# Patient Record
Sex: Female | Born: 1995 | Race: Black or African American | Hispanic: No | Smoking: Never smoker
Health system: Southern US, Community
[De-identification: ages and names within clinical notes are randomized; demographics above are authoritative.]

## PROBLEM LIST (undated history)

## (undated) ENCOUNTER — Inpatient Hospital Stay (HOSPITAL_COMMUNITY): Payer: Self-pay

## (undated) DIAGNOSIS — B999 Unspecified infectious disease: Secondary | ICD-10-CM

## (undated) HISTORY — PX: NO PAST SURGERIES: SHX2092

## (undated) HISTORY — PX: TOOTH EXTRACTION: SUR596

---
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
## 2013-03-14 ENCOUNTER — Encounter: Payer: Self-pay | Admitting: Advanced Practice Midwife

## 2013-04-05 ENCOUNTER — Ambulatory Visit: Payer: Self-pay | Admitting: Advanced Practice Midwife

## 2013-05-22 ENCOUNTER — Emergency Department (INDEPENDENT_AMBULATORY_CARE_PROVIDER_SITE_OTHER)
Admission: EM | Admit: 2013-05-22 | Discharge: 2013-05-22 | Disposition: A | Discharge status: Home / Self Care | Payer: Medicaid Other | Source: Home / Self Care | Attending: Family Medicine | Admitting: Family Medicine

## 2013-05-22 ENCOUNTER — Encounter (HOSPITAL_COMMUNITY): Payer: Self-pay | Admitting: Emergency Medicine

## 2013-05-22 DIAGNOSIS — H10219 Acute toxic conjunctivitis, unspecified eye: Secondary | ICD-10-CM

## 2013-05-22 DIAGNOSIS — H10212 Acute toxic conjunctivitis, left eye: Secondary | ICD-10-CM

## 2013-05-22 MED ORDER — PREDNISOLONE ACETATE 0.12 % OP SUSP: 1.0000 [drp] | OPHTHALMIC | Status: DC

## 2013-05-22 MED ORDER — TETRACAINE HCL 0.5 % OP SOLN
OPHTHALMIC | Status: AC
Start: 2013-05-22 — End: 2013-05-22
  Filled 2013-05-22: qty 2

## 2013-05-22 NOTE — ED Notes (Signed)
L  Eye  Irritated     With  Morgan lens        500  ml

## 2013-05-22 NOTE — ED Provider Notes (Signed)
CSN: [id number]     Arrival date & time 05/22/13  1556 History   First MD Initiated Contact with Patient 05/22/13 1609     Chief Complaint  Patient presents with  . Eye Problem   (Consider location/radiation/quality/duration/timing/severity/associated sxs/prior Treatment) Patient is a 18 y.o. female presenting with eye problem. The history is provided by the patient.  Eye Problem Location:  L eye Quality:  Burning Severity:  Mild Onset quality:  Sudden Duration:  4 hours Progression:  Unchanged Chronicity:  New Context: contact lenses   Context comment:  Put contact cleaning sol in left eye, has flushed eye, still with discomfort, Ineffective treatments:  Flushing Associated symptoms: blurred vision and decreased vision   Associated symptoms: no discharge, no double vision, no itching, no photophobia and no redness     History reviewed. No pertinent past medical history. History reviewed. No pertinent past surgical history. No family history on file. History  Substance Use Topics  . Smoking status: Never Smoker   . Smokeless tobacco: Not on file  . Alcohol Use: No   OB History   Grav Para Term Preterm Abortions TAB SAB Ect Mult Living                 Review of Systems  Constitutional: Negative.   HENT: Negative.   Eyes: Positive for blurred vision and visual disturbance. Negative for double vision, photophobia, pain, discharge, redness and itching.    Allergies  Review of patient's allergies indicates no known allergies.  Home Medications   Prior to Admission medications   Medication Sig Start Date End Date Taking? Authorizing Provider  cephALEXin (KEFLEX) 500 MG capsule Take 1 capsule (500 mg total) by mouth every 6 (six) hours. 07/06/12   Zonia KiefJames Owens, PA-C  doxycycline (VIBRAMYCIN) 100 MG capsule Take 1 capsule (100 mg total) by mouth 2 (two) times daily. 08/03/12   Linna HoffJames D Anahi Belmar, MD  HYDROcodone-acetaminophen (NORCO) 5-325 MG per tablet Take 1 tablet by mouth  every 8 (eight) hours as needed for pain. 07/06/12   Zonia KiefJames Owens, PA-C  sulfamethoxazole-trimethoprim (SEPTRA DS) 800-160 MG per tablet Take 1 tablet by mouth every 12 (twelve) hours. 07/16/12   Zonia KiefJames Owens, PA-C   BP 118/70  Pulse 83  Temp(Src) 98.2 F (36.8 C) (Oral)  Resp 18  SpO2 100% Physical Exam  Nursing note and vitals reviewed. Constitutional: She appears well-developed and well-nourished.  Eyes: Conjunctivae, EOM and lids are normal. Pupils are equal, round, and reactive to light. Lids are everted and swept, no foreign bodies found. Right eye exhibits no discharge. Left eye exhibits no discharge and no exudate. No foreign body present in the left eye. Right conjunctiva is not injected. Left conjunctiva is not injected.  Slit lamp exam:      The left eye shows no foreign body and no hyphema.    ED Course  Procedures (including critical care time) Labs Review Labs Reviewed - No data to display  Imaging Review No results found.   MDM   1. Acute chemical conjunctivitis of left eye    Eye irrigated with 1liter ns via morgan lens , sx improved    Linna HoffJames D Lynnix Schoneman, MD 05/22/13 1644

## 2013-05-22 NOTE — ED Notes (Signed)
Pt  Reports    She got   Contact  Lens  Cleaning  Solution  In l  Eye  About  3  Hours  Ago  She  Reports    Irritated    And  Burning            She  Reports  She  Attempted  To  Wash  Her  Eye  Out         She  Normally   Wears  Contacts    Lenses  And  Cannot  See  Well  Without  Them

## 2013-05-22 NOTE — Discharge Instructions (Signed)
Use eye medicine as prescribed and call for recheck with eye doctor if further problems.

## 2013-05-22 NOTE — ED Notes (Signed)
Pt put contact cleaning solution in lt eye instead of saline; flushed eye for some time afterwards.  Pt not wearing contacts, and unable to focus on eye chart through either eye or both simultaneously.

## 2014-07-27 ENCOUNTER — Encounter (HOSPITAL_COMMUNITY): Payer: Self-pay

## 2014-07-27 ENCOUNTER — Emergency Department (HOSPITAL_COMMUNITY)
Admission: EM | Admit: 2014-07-27 | Discharge: 2014-07-28 | Disposition: A | Discharge status: Home / Self Care | Payer: Medicaid Other | Attending: Emergency Medicine | Admitting: Emergency Medicine

## 2014-07-27 DIAGNOSIS — Y9289 Other specified places as the place of occurrence of the external cause: Secondary | ICD-10-CM | POA: Insufficient documentation

## 2014-07-27 DIAGNOSIS — X58XXXA Exposure to other specified factors, initial encounter: Secondary | ICD-10-CM | POA: Insufficient documentation

## 2014-07-27 DIAGNOSIS — Y990 Civilian activity done for income or pay: Secondary | ICD-10-CM | POA: Insufficient documentation

## 2014-07-27 DIAGNOSIS — S39012A Strain of muscle, fascia and tendon of lower back, initial encounter: Secondary | ICD-10-CM

## 2014-07-27 DIAGNOSIS — Y9389 Activity, other specified: Secondary | ICD-10-CM | POA: Insufficient documentation

## 2014-07-27 DIAGNOSIS — Z792 Long term (current) use of antibiotics: Secondary | ICD-10-CM | POA: Insufficient documentation

## 2014-07-27 NOTE — ED Notes (Signed)
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  Reports sharp pains continue, worse with movement.

## 2014-07-28 MED ORDER — NAPROXEN 500 MG PO TABS: 500.0000 mg | ORAL_TABLET | Freq: Two times a day (BID) | ORAL | Status: DC

## 2014-07-28 MED ORDER — METHOCARBAMOL 500 MG PO TABS: 500.0000 mg | ORAL_TABLET | Freq: Two times a day (BID) | ORAL | Status: DC

## 2014-07-28 MED ORDER — NAPROXEN 500 MG PO TABS
500.0000 mg | ORAL_TABLET | Freq: Once | ORAL | Status: AC
  Administered 2014-07-28: 500 mg via ORAL
  Filled 2014-07-28: qty 1

## 2014-07-28 NOTE — ED Provider Notes (Signed)
CSN: [id number]     Arrival date & time 07/27/14  2317 History   First MD Initiated Contact with Patient 07/28/14 0008     Chief Complaint  Patient presents with  . Back Pain    (Consider location/radiation/quality/duration/timing/severity/associated sxs/prior Treatment) Patient is a 19 y.o. female presenting with back pain. The history is provided by the patient. No language interpreter was used.  Back Pain Location:  Lumbar spine Quality:  Aching and stiffness Radiates to:  Does not radiate Pain severity:  Mild Worse during: worse with movement and ambulation. Onset quality:  Sudden Duration:  2 hours Timing:  Intermittent Progression:  Waxing and waning Chronicity:  New Context: lifting heavy objects   Context comment:  Patient lifting a patient at work Relieved by:  Nothing Worsened by:  Ambulation, movement and bending Ineffective treatments:  None tried Associated symptoms: no bladder incontinence, no bowel incontinence, no dysuria, no fever, no numbness, no paresthesias, no perianal numbness and no tingling     History reviewed. No pertinent past medical history. History reviewed. No pertinent past surgical history. No family history on file. History  Substance Use Topics  . Smoking status: Never Smoker   . Smokeless tobacco: Not on file  . Alcohol Use: No   OB History    No data available      Review of Systems  Constitutional: Negative for fever.  Gastrointestinal: Negative for bowel incontinence.  Genitourinary: Negative for bladder incontinence and dysuria.  Musculoskeletal: Positive for back pain.  Neurological: Negative for tingling, numbness and paresthesias.  All other systems reviewed and are negative.   Allergies  Review of patient's allergies indicates no known allergies.  Home Medications   Prior to Admission medications   Medication Sig Start Date End Date Taking? Authorizing Provider  cephALEXin (KEFLEX) 500 MG capsule Take 1 capsule  (500 mg total) by mouth every 6 (six) hours. 07/06/12   Naida Sleight, PA-C  doxycycline (VIBRAMYCIN) 100 MG capsule Take 1 capsule (100 mg total) by mouth 2 (two) times daily. 08/03/12   Linna Hoff, MD  HYDROcodone-acetaminophen (NORCO) 5-325 MG per tablet Take 1 tablet by mouth every 8 (eight) hours as needed for pain. 07/06/12   Naida Sleight, PA-C  methocarbamol (ROBAXIN) 500 MG tablet Take 1 tablet (500 mg total) by mouth 2 (two) times daily. 07/28/14   Antony Madura, PA-C  naproxen (NAPROSYN) 500 MG tablet Take 1 tablet (500 mg total) by mouth 2 (two) times daily. 07/28/14   Antony Madura, PA-C  prednisoLONE acetate (PRED MILD) 0.12 % ophthalmic suspension Place 1 drop into the left eye every 4 (four) hours. For 3 days 05/22/13   Linna Hoff, MD  sulfamethoxazole-trimethoprim (SEPTRA DS) 800-160 MG per tablet Take 1 tablet by mouth every 12 (twelve) hours. 07/16/12   Naida Sleight, PA-C   BP 135/65 mmHg  Pulse 76  Temp(Src) 98.6 F (37 C) (Oral)  Resp 16  SpO2 100% Physical Exam  Constitutional: She is oriented to person, place, and time. She appears well-developed and well-nourished. No distress.  HENT:  Head: Normocephalic and atraumatic.  Eyes: Conjunctivae and EOM are normal. No scleral icterus.  Neck: Normal range of motion.  Cardiovascular: Normal rate, regular rhythm and intact distal pulses.   DP and PT pulses 2+ bilaterally  Pulmonary/Chest: Effort normal. No respiratory distress.  Musculoskeletal: Normal range of motion. She exhibits tenderness.  Tenderness to palpation to the left lumbar paraspinal muscles without significant spasm. No tenderness to palpation  to the thoracic or lumbar midline. No bony deformities, step-offs, or crepitus.  Neurological: She is alert and oriented to person, place, and time. She exhibits normal muscle tone. Coordination normal.  Sensation to light touch intact in all extremities. Patient ambulatory with steady gait.  Skin: Skin is warm and dry. No  rash noted. She is not diaphoretic. No erythema. No pallor.  Psychiatric: She has a normal mood and affect. Her behavior is normal.  Nursing note and vitals reviewed.   ED Course  Procedures (including critical care time) Labs Review Labs Reviewed - No data to display  Imaging Review No results found.   EKG Interpretation None      MDM   Final diagnoses:  Low back strain, initial encounter    Patient with back pain x 2 hours. 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 Patient is neurovascularly intact. Patient can walk but states is painful. No loss of bowel or bladder control. No concern for cauda equina. No fever, urinary symptoms, or weakness. RICE protocol, NSAIDs, and Robaxin indicated and discussed with patient. Return precautions discussed and provided. Patient discharged in good condition with no unaddressed concerns.   Filed Vitals:   07/27/14 2344  BP: 135/65  Pulse: 76  Temp: 98.6 F (37 C)  TempSrc: Oral  Resp: 16  SpO2: 100%     Antony Madura, PA-C 07/28/14 0034  Layla Maw Ward, DO 07/28/14 1191

## 2014-07-28 NOTE — Discharge Instructions (Signed)

## 2015-01-07 NOTE — L&D Delivery Note (Signed)
20 y/o G1P1 who presented for post dates induction under went Cytotec, foley balloon and pitocin induction  Delivery Note At  a viable Female was delivered via  (Presentation:cephalic ;  ).  APGAR:8 ,9 ; w.   Placenta status: delivered intact with gentle traction.  Cord: 3 vessel with the following complications:one loose nucal .    Anesthesia:  epidural Episiotomy:   none Lacerations:   none Est. Blood Loss (mL):   100  Mom to postpartum.  Baby to Couplet care / Skin to Skin.  Ernestina Pennaicholas Dover Jon 09/05/2015, 1:02 AM

## 2015-02-22 LAB — HIV ANTIBODY: HIV SCREEN 4TH GENERATION: NONREACTIVE

## 2015-02-22 LAB — OB RESULTS CONSOLE RPR
RPR: NONREACTIVE
RPR: NONREACTIVE

## 2015-02-22 LAB — OB RESULTS CONSOLE HIV ANTIBODY (ROUTINE TESTING)
HIV: NONREACTIVE
HIV: NONREACTIVE

## 2015-02-22 LAB — SICKLE CELL SCREEN: Sickle Cell Screen: NEGATIVE

## 2015-02-22 LAB — OB RESULTS CONSOLE HEPATITIS B SURFACE ANTIGEN: Hepatitis B Surface Ag: NEGATIVE

## 2015-02-22 LAB — RPR: RPR: NONREACTIVE

## 2015-02-22 LAB — OB RESULTS CONSOLE RUBELLA ANTIBODY, IGM: Rubella: IMMUNE

## 2015-03-17 ENCOUNTER — Encounter (HOSPITAL_COMMUNITY): Payer: Self-pay

## 2015-03-17 ENCOUNTER — Inpatient Hospital Stay (HOSPITAL_COMMUNITY)
Admission: AD | Admit: 2015-03-17 | Discharge: 2015-03-17 | Disposition: A | Discharge status: Home / Self Care | Payer: Medicaid Other | Source: Ambulatory Visit | Attending: Obstetrics | Admitting: Obstetrics

## 2015-03-17 DIAGNOSIS — B9689 Other specified bacterial agents as the cause of diseases classified elsewhere: Secondary | ICD-10-CM

## 2015-03-17 DIAGNOSIS — A499 Bacterial infection, unspecified: Secondary | ICD-10-CM

## 2015-03-17 DIAGNOSIS — O26852 Spotting complicating pregnancy, second trimester: Secondary | ICD-10-CM

## 2015-03-17 DIAGNOSIS — N760 Acute vaginitis: Secondary | ICD-10-CM | POA: Diagnosis not present

## 2015-03-17 DIAGNOSIS — O23592 Infection of other part of genital tract in pregnancy, second trimester: Secondary | ICD-10-CM | POA: Insufficient documentation

## 2015-03-17 DIAGNOSIS — Z3A16 16 weeks gestation of pregnancy: Secondary | ICD-10-CM | POA: Diagnosis not present

## 2015-03-17 LAB — URINALYSIS, ROUTINE W REFLEX MICROSCOPIC
BILIRUBIN URINE: NEGATIVE
GLUCOSE, UA: NEGATIVE mg/dL
HGB URINE DIPSTICK: NEGATIVE
Ketones, ur: 40 mg/dL — AB
Leukocytes, UA: NEGATIVE
Nitrite: NEGATIVE
PROTEIN: NEGATIVE mg/dL
Specific Gravity, Urine: 1.015 (ref 1.005–1.030)
pH: 7 (ref 5.0–8.0)

## 2015-03-17 LAB — CBC
HCT: 35 % — ABNORMAL LOW (ref 36.0–46.0)
HEMOGLOBIN: 12 g/dL (ref 12.0–15.0)
MCH: 28.6 pg (ref 26.0–34.0)
MCHC: 34.3 g/dL (ref 30.0–36.0)
MCV: 83.3 fL (ref 78.0–100.0)
Platelets: 290 10*3/uL (ref 150–400)
RBC: 4.2 MIL/uL (ref 3.87–5.11)
RDW: 13.2 % (ref 11.5–15.5)
WBC: 12.5 10*3/uL — ABNORMAL HIGH (ref 4.0–10.5)

## 2015-03-17 LAB — WET PREP, GENITAL
SPERM: NONE SEEN
Trich, Wet Prep: NONE SEEN
YEAST WET PREP: NONE SEEN

## 2015-03-17 LAB — ABO/RH: ABO/RH(D): A POS

## 2015-03-17 MED ORDER — METRONIDAZOLE 500 MG PO TABS: 500.0000 mg | ORAL_TABLET | Freq: Two times a day (BID) | ORAL | Status: DC

## 2015-03-17 NOTE — Discharge Instructions (Signed)

## 2015-03-17 NOTE — MAU Provider Note (Signed)
History     CSN: [id number]  Arrival date and time: 03/17/15 1151   First Provider Initiated Contact with Patient 03/17/15 1219      Chief Complaint  Patient presents with  . Abdominal Pain  . Vaginal Bleeding   HPI 20 y.o. G1P0 at 3144w4d w/ report of light vaginal bleeding starting today. She states she had intercourse yesterday morning, then ran into a door knob just before she went to bed, impact to left side of abdomen, did not hurt at that time, now states she is a "little sore" on that side.   No past medical history on file.  No past surgical history on file.  No family history on file.  Social History  Substance Use Topics  . Smoking status: Never Smoker   . Smokeless tobacco: None  . Alcohol Use: No    Allergies: No Known Allergies  No prescriptions prior to admission    Review of Systems  Constitutional: Negative.   Respiratory: Negative.   Cardiovascular: Negative.   Gastrointestinal: Negative for nausea, vomiting, abdominal pain, diarrhea and constipation.  Genitourinary: Negative for dysuria, urgency, frequency, hematuria and flank pain.       Negative for cramping/contractions; + bleeding  Musculoskeletal: Negative.   Neurological: Negative.   Psychiatric/Behavioral: Negative.    Physical Exam   Blood pressure 120/74, pulse 96, temperature 98.5 F (36.9 C), temperature source Oral, resp. rate 16, height 5' (1.524 m), weight 132 lb (59.875 kg), last menstrual period 11/21/2014.  Physical Exam  Nursing note and vitals reviewed. Constitutional: She is oriented to person, place, and time. She appears well-developed and well-nourished. No distress.  Cardiovascular: Normal rate.   Respiratory: Effort normal.  GI: Soft. There is no tenderness.  Genitourinary: Uterus is enlarged (c/w dates). Uterus is not tender. Cervix exhibits friability (very slightly friable cervix). Cervix exhibits no discharge. Right adnexum displays no mass, no tenderness and no  fullness. Left adnexum displays no mass, no tenderness and no fullness. No bleeding (no evidence of bleeding) in the vagina. No vaginal discharge found.  Musculoskeletal: Normal range of motion.  Neurological: She is alert and oriented to person, place, and time.  Skin: Skin is warm and dry.  Psychiatric: She has a normal mood and affect.   + FHR - 164  MAU Course  Procedures Results for orders placed or performed during the hospital encounter of 03/17/15 (from the past 24 hour(s))  Urinalysis, Routine w reflex microscopic (not [facility])     Status: Abnormal   Collection Time: 03/17/15 12:02 PM  Result Value Ref Range   Color, Urine YELLOW YELLOW   APPearance HAZY (A) CLEAR   Specific Gravity, Urine 1.015 1.005 - 1.030   pH 7.0 5.0 - 8.0   Glucose, UA NEGATIVE NEGATIVE mg/dL   Hgb urine dipstick NEGATIVE NEGATIVE   Bilirubin Urine NEGATIVE NEGATIVE   Ketones, ur 40 (A) NEGATIVE mg/dL   Protein, ur NEGATIVE NEGATIVE mg/dL   Nitrite NEGATIVE NEGATIVE   Leukocytes, UA NEGATIVE NEGATIVE  ABO/Rh     Status: None (Preliminary result)   Collection Time: 03/17/15 12:11 PM  Result Value Ref Range   ABO/RH(D) A POS   CBC     Status: Abnormal   Collection Time: 03/17/15 12:11 PM  Result Value Ref Range   WBC 12.5 (H) 4.0 - 10.5 K/uL   RBC 4.20 3.87 - 5.11 MIL/uL   Hemoglobin 12.0 12.0 - 15.0 g/dL   HCT 78.235.0 (L) 95.636.0 - 21.346.0 %  MCV 83.3 78.0 - 100.0 fL   MCH 28.6 26.0 - 34.0 pg   MCHC 34.3 30.0 - 36.0 g/dL   RDW 16.1 09.6 - 04.5 %   Platelets 290 150 - 400 K/uL  Wet prep, genital     Status: Abnormal   Collection Time: 03/17/15 12:25 PM  Result Value Ref Range   Yeast Wet Prep HPF POC NONE SEEN NONE SEEN   Trich, Wet Prep NONE SEEN NONE SEEN   Clue Cells Wet Prep HPF POC PRESENT (A) NONE SEEN   WBC, Wet Prep HPF POC FEW (A) NONE SEEN   Sperm NONE SEEN      Assessment and Plan   1. Spotting affecting pregnancy in second trimester   2. Bacterial vaginosis   Postcoital  spotting, no evidence of bleeding at this time. Follow up as scheduled in clinic this week. Precautions rev'd. Flagyl for BV.     Medication List    STOP taking these medications        cephALEXin 500 MG capsule  Commonly known as:  KEFLEX     doxycycline 100 MG capsule  Commonly known as:  VIBRAMYCIN     HYDROcodone-acetaminophen 5-325 MG tablet  Commonly known as:  NORCO     methocarbamol 500 MG tablet  Commonly known as:  ROBAXIN     naproxen 500 MG tablet  Commonly known as:  NAPROSYN     prednisoLONE acetate 0.12 % ophthalmic suspension  Commonly known as:  PRED MILD     sulfamethoxazole-trimethoprim 800-160 MG tablet  Commonly known as:  SEPTRA DS      TAKE these medications        metroNIDAZOLE 500 MG tablet  Commonly known as:  FLAGYL  Take 1 tablet (500 mg total) by mouth 2 (two) times daily.     prenatal multivitamin Tabs tablet  Take 1 tablet by mouth daily at 12 noon.            Follow-up Information    Follow up with Kathreen Cosier, MD.   Specialty:  Obstetrics and Gynecology   Why:  as scheduled or sooner as needed   Contact information:   [address] [place] [address] [phone]         [facility] 03/17/2015, 2:07 PM

## 2015-03-17 NOTE — MAU Note (Addendum)
Ran into door know yesterday hitting left side of abdomen, started having vaginal bleeding this morning noticed on tissue when wiping, soreness in abdomen at present, not wearing a pad. Recent intercourse yesterday.

## 2015-03-18 LAB — HIV ANTIBODY (ROUTINE TESTING W REFLEX): HIV Screen 4th Generation wRfx: NONREACTIVE

## 2015-03-19 LAB — GC/CHLAMYDIA PROBE AMP (~~LOC~~) NOT AT ARMC
CHLAMYDIA, DNA PROBE: NEGATIVE
Neisseria Gonorrhea: NEGATIVE

## 2015-05-04 ENCOUNTER — Inpatient Hospital Stay (HOSPITAL_COMMUNITY)
Admission: AD | Admit: 2015-05-04 | Discharge: 2015-05-04 | Disposition: A | Discharge status: Home / Self Care | Payer: Medicaid Other | Source: Ambulatory Visit | Attending: Obstetrics | Admitting: Obstetrics

## 2015-05-04 ENCOUNTER — Encounter (HOSPITAL_COMMUNITY): Payer: Self-pay | Admitting: *Deleted

## 2015-05-04 DIAGNOSIS — O9A212 Injury, poisoning and certain other consequences of external causes complicating pregnancy, second trimester: Secondary | ICD-10-CM | POA: Diagnosis not present

## 2015-05-04 DIAGNOSIS — W19XXXA Unspecified fall, initial encounter: Secondary | ICD-10-CM

## 2015-05-04 DIAGNOSIS — M791 Myalgia: Secondary | ICD-10-CM | POA: Diagnosis not present

## 2015-05-04 DIAGNOSIS — Z3A23 23 weeks gestation of pregnancy: Secondary | ICD-10-CM | POA: Insufficient documentation

## 2015-05-04 DIAGNOSIS — Y9301 Activity, walking, marching and hiking: Secondary | ICD-10-CM | POA: Diagnosis not present

## 2015-05-04 DIAGNOSIS — W1839XA Other fall on same level, initial encounter: Secondary | ICD-10-CM | POA: Insufficient documentation

## 2015-05-04 DIAGNOSIS — R55 Syncope and collapse: Secondary | ICD-10-CM | POA: Diagnosis not present

## 2015-05-04 DIAGNOSIS — O26892 Other specified pregnancy related conditions, second trimester: Secondary | ICD-10-CM | POA: Insufficient documentation

## 2015-05-04 DIAGNOSIS — T1490 Injury, unspecified: Secondary | ICD-10-CM

## 2015-05-04 DIAGNOSIS — M7918 Myalgia, other site: Secondary | ICD-10-CM

## 2015-05-04 DIAGNOSIS — M25512 Pain in left shoulder: Secondary | ICD-10-CM | POA: Insufficient documentation

## 2015-05-04 LAB — COMPREHENSIVE METABOLIC PANEL
ALK PHOS: 53 U/L (ref 38–126)
ALT: 23 U/L (ref 14–54)
ANION GAP: 4 — AB (ref 5–15)
AST: 24 U/L (ref 15–41)
Albumin: 3 g/dL — ABNORMAL LOW (ref 3.5–5.0)
BUN: 5 mg/dL — ABNORMAL LOW (ref 6–20)
CHLORIDE: 107 mmol/L (ref 101–111)
CO2: 25 mmol/L (ref 22–32)
Calcium: 8.9 mg/dL (ref 8.9–10.3)
Creatinine, Ser: 0.49 mg/dL (ref 0.44–1.00)
Glucose, Bld: 83 mg/dL (ref 65–99)
Potassium: 4 mmol/L (ref 3.5–5.1)
Sodium: 136 mmol/L (ref 135–145)
TOTAL PROTEIN: 5.9 g/dL — AB (ref 6.5–8.1)
Total Bilirubin: 0.2 mg/dL — ABNORMAL LOW (ref 0.3–1.2)

## 2015-05-04 LAB — CBC
HCT: 33.2 % — ABNORMAL LOW (ref 36.0–46.0)
HEMOGLOBIN: 11.1 g/dL — AB (ref 12.0–15.0)
MCH: 28 pg (ref 26.0–34.0)
MCHC: 33.4 g/dL (ref 30.0–36.0)
MCV: 83.6 fL (ref 78.0–100.0)
Platelets: 304 10*3/uL (ref 150–400)
RBC: 3.97 MIL/uL (ref 3.87–5.11)
RDW: 13.4 % (ref 11.5–15.5)
WBC: 17.2 10*3/uL — ABNORMAL HIGH (ref 4.0–10.5)

## 2015-05-04 LAB — URINALYSIS, ROUTINE W REFLEX MICROSCOPIC
BILIRUBIN URINE: NEGATIVE
GLUCOSE, UA: NEGATIVE mg/dL
Ketones, ur: NEGATIVE mg/dL
NITRITE: NEGATIVE
PH: 6 (ref 5.0–8.0)
Protein, ur: NEGATIVE mg/dL
SPECIFIC GRAVITY, URINE: 1.02 (ref 1.005–1.030)

## 2015-05-04 LAB — URINE MICROSCOPIC-ADD ON: RBC / HPF: NONE SEEN RBC/hpf (ref 0–5)

## 2015-05-04 MED ORDER — IBUPROFEN 600 MG PO TABS: 600.0000 mg | ORAL_TABLET | Freq: Four times a day (QID) | ORAL | Status: DC | PRN

## 2015-05-04 MED ORDER — OXYCODONE-ACETAMINOPHEN 5-325 MG PO TABS: 1.0000 | ORAL_TABLET | Freq: Four times a day (QID) | ORAL | Status: DC | PRN

## 2015-05-04 NOTE — MAU Note (Signed)
Pt states she was at work this morning about 0100 she felt a little weird.  Ran to elevator and next thing she knew someone said she had passed out.  Pt states she normally feels the baby move but didn't feel yesterday prior or after the fall.  Pt states her left side is sore and hurts.  Denies vaginal bleeding or ROM.

## 2015-05-04 NOTE — MAU Provider Note (Signed)
History     CSN: [id number]  Arrival date and time: 05/04/15 0701   First Provider Initiated Contact with Patient 05/04/15 0804     HPI: 20 y.o. 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  She reports she became hot and dizzy and her vision was closing in/going black. She was trying to walk out of the room and to the elevator at work when the symptoms occurred, then she woke up with her co-worker telling her she fell. She fell on her left side/shoulder and did not hit her abdomen.  She reports soreness/pain in her left shoulder and arm but can move her arm normally.  She reports one cramp lasting 20-30 minutes after the fall occurred but she was able to go home and sleep a couple of hours and it went away. She denies abdominal pain/cramping since that time.  She feels normal fetal movement this morning.  She denies vaginal bleeding or LOF.  She reports she was eating/drinking regularly before the fall/syncopal episode occurred.  She denies dizziness since the single episode occurred and is tolerating PO food/fluids well.     Chief Complaint  Patient presents with  . Fall  . Loss of Consciousness   Fall The accident occurred 6 to 12 hours ago. The fall occurred while walking. She fell from a height of 3 to 5 ft. She landed on carpet. There was no blood loss. The point of impact was the left shoulder and left hip. The pain is present in the left shoulder and back. The pain is at a severity of 7/10. Associated symptoms include a loss of consciousness. Pertinent negatives include no abdominal pain, fever, headaches, nausea or vomiting. She has tried nothing for the symptoms.  Loss of Consciousness This is a new problem. The current episode started today. Episode frequency: once. The problem has been resolved. Length of episode of loss of consciousness: unsure.  Associated symptoms include back pain. Pertinent negatives include no  abdominal pain, chest pain, dizziness, fever, headaches, malaise/fatigue, nausea or vomiting.     History reviewed. No pertinent past medical history.  History reviewed. No pertinent past surgical history.  History reviewed. No pertinent family history.  Social History  Substance Use Topics  . Smoking status: Never Smoker   . Smokeless tobacco: None  . Alcohol Use: No    Allergies: No Known Allergies  Prescriptions prior to admission  Medication Sig Dispense Refill Last Dose  . metroNIDAZOLE (FLAGYL) 500 MG tablet Take 1 tablet (500 mg total) by mouth 2 (two) times daily. 14 tablet 0   . Prenatal Vit-Fe Fumarate-FA (PRENATAL MULTIVITAMIN) TABS tablet Take 1 tablet by mouth daily at 12 noon.   03/16/2015 at Unknown time    Review of Systems  Constitutional: Negative for fever, chills and malaise/fatigue.  Eyes: Negative for blurred vision.  Respiratory: Negative for cough and shortness of breath.   Cardiovascular: Positive for syncope. Negative for chest pain.  Gastrointestinal: Negative for heartburn, nausea, vomiting and abdominal pain.  Genitourinary: Negative for dysuria, urgency and frequency.  Musculoskeletal: Positive for myalgias, back pain and falls.  Neurological: Positive for loss of consciousness. Negative for dizziness and headaches.  Psychiatric/Behavioral: Negative for depression.   Physical Exam   Blood pressure 116/82, pulse 80, temperature 98.6 F (37 C), temperature source Oral, resp. rate 18, last menstrual period 11/21/2014, SpO2 98 %.  Physical Exam  Nursing note and vitals reviewed. Constitutional:  She is oriented to person, place, and time. She appears well-developed and well-nourished. No distress.  Neck: Normal range of motion.  Cardiovascular: Normal rate.   Respiratory: Effort normal.  GI: Soft. There is no tenderness.  Musculoskeletal: Normal range of motion. She exhibits tenderness.  Tenderness to palpation of left upper arm and left  shoulder. Normal ROM by pt with minimal discomfort.  Neurological: She is alert and oriented to person, place, and time.  Skin: Skin is warm and dry.  Psychiatric: She has a normal mood and affect. Her behavior is normal. Judgment and thought content normal.   Results for orders placed or performed during the hospital encounter of 05/04/15 (from the past 24 hour(s))  CBC     Status: Abnormal   Collection Time: 05/04/15  8:20 AM  Result Value Ref Range   WBC 17.2 (H) 4.0 - 10.5 K/uL   RBC 3.97 3.87 - 5.11 MIL/uL   Hemoglobin 11.1 (L) 12.0 - 15.0 g/dL   HCT 11.9 (L) 14.7 - 82.9 %   MCV 83.6 78.0 - 100.0 fL   MCH 28.0 26.0 - 34.0 pg   MCHC 33.4 30.0 - 36.0 g/dL   RDW 56.2 13.0 - 86.5 %   Platelets 304 150 - 400 K/uL  Comprehensive metabolic panel     Status: Abnormal   Collection Time: 05/04/15  8:20 AM  Result Value Ref Range   Sodium 136 135 - 145 mmol/L   Potassium 4.0 3.5 - 5.1 mmol/L   Chloride 107 101 - 111 mmol/L   CO2 25 22 - 32 mmol/L   Glucose, Bld 83 65 - 99 mg/dL   BUN 5 (L) 6 - 20 mg/dL   Creatinine, Ser 7.84 0.44 - 1.00 mg/dL   Calcium 8.9 8.9 - 69.6 mg/dL   Total Protein 5.9 (L) 6.5 - 8.1 g/dL   Albumin 3.0 (L) 3.5 - 5.0 g/dL   AST 24 15 - 41 U/L   ALT 23 14 - 54 U/L   Alkaline Phosphatase 53 38 - 126 U/L   Total Bilirubin 0.2 (L) 0.3 - 1.2 mg/dL   GFR calc non Af Amer >60 >60 mL/min   GFR calc Af Amer >60 >60 mL/min   Anion gap 4 (L) 5 - 15   UA pending  MAU Course  Procedures  MDM 0800 Care turned over to [facility], CNM CBC, CMET pending  LEFTWICH-KIRBY, Gillie Crisci  05/04/2015 9:17 AM   MDM:  With normal FHR monitoring 7 hours after pt fall, and no abdominal pain/contractions, no evidence of fetal distress or preterm labor noted.  CBC, CMP wnl.  Will treat musculoskeletal pain with short 3 day course of ibuprofen 600 mg PO Q 6 hours, and Percocet 5/325, take 1-2 tabs Q 6 hours x 6 tabs only for severe pain.  If pain in arm/shoulder persist, discussed  recommended follow up with primary care/Urgent Care.  Return to MAU if pregnancy warning signs or signs of PTL, which were reviewed with pt.   Pt stable at time of discharge.  Assessment and Plan   1. Traumatic injury during pregnancy in second trimester   2. Fall, initial encounter   3. Syncope, unspecified syncope type   4. Musculoskeletal pain     D/C home  Follow-up Information    Follow up with Kathreen Cosier, MD.   Specialty:  Obstetrics and Gynecology   Why:  As scheduled, Return to MAU as needed for emergencies   Contact information:   [address] [place] [id number] [phone]       Please follow up.   Why:  With primary/Urgent Care if shoulder/arm pain worsen/persist.     LEFTWICH-KIRBY, Guerline Happ, CNM 9:17 AM

## 2015-05-04 NOTE — Discharge Instructions (Signed)
What Do I Need to Know About Injuries During Pregnancy? Trauma is the most common cause of injury and death in pregnant women. This can also result in significant harm or death of the baby. Your baby is protected in the womb (uterus) by a sac filled with fluid (amniotic sac). Your baby can be harmed if there is direct, high-impact trauma to your abdomen and pelvis. This type of trauma can result in tearing of your uterus, the placenta pulling away from the wall of the uterus (placenta abruption), or the amniotic sac breaking open (rupture of membranes). These injuries can decrease or stop the blood supply to your baby or cause you to go into labor earlier than expected. Minor falls and low-impact automobile accidents do not usually harm your baby, even if they do minimally harm you. WHAT KIND OF INJURIES CAN AFFECT MY PREGNANCY? The most common causes of injury or death to a baby include:  Falls. Falls are more common in the second and third trimester of the pregnancy. Factors that increase your risk of falling include:  Increase in your weight.  The change in your center of gravity.  Tripping over an object that cannot be seen.  Increased looseness (laxity) of your ligaments resulting in less coordinated movements (you may feel clumsy).  Falling during high-risk activities like horseback riding or skiing.  Automobile accidents. It is important to wear your seat belt properly, with the lap belt below your abdomen, and always practice safe driving.  Domestic violence or assault.  Burns (Metallurgistfire or electrical). The most common causes of injury or death to the pregnant woman include:  Injuries that cause severe bleeding, shock, and loss of blood flow to major organs.  Head and neck injuries that result in severe brain or spinal damage.  Chest trauma that can cause direct injury to the heart and lungs or any injury that affects the area enclosed by the ribs. Trauma to this area can result in  cardiorespiratory arrest. WHAT CAN I DO TO PROTECT MYSELF AND MY BABY FROM INJURY WHILE I AM PREGNANT?  Remove slippery rugs and loose objects on the floor that increase your risk of tripping.  Avoid walking on wet or slippery floors.  Wear comfortable shoes that have a good grip on the sole. Do not wear high-heeled shoes.  Always wear your seat belt properly, with the lap belt below your abdomen, and always practice safe driving. Do not ride on a motorcycle while pregnant.  Do not participate in high-impact activities or sports.  Avoid fires, starting fires, lifting heavy pots of boiling or hot liquids, and fixing electrical problems.  Only take over-the-counter or prescription medicines for pain, fever, or discomfort as directed by your health care provider.  Know your blood type and the father's blood type in case you develop vaginal bleeding or experience an injury for which a blood transfusion may be necessary.  Call your local emergency services (911 in the [place] if you are a victim of domestic violence or assault. Spousal abuse can be a significant cause of trauma during pregnancy. For help and support, contact the [employer] Domestic Violence Hotline. WHEN SHOULD I SEEK IMMEDIATE MEDICAL CARE?   You fall on your abdomen or experience any high-force accident or injury.  You have been assaulted (domestic or otherwise).  You have been in a car accident.  You develop vaginal bleeding.  You develop fluid leaking from the vagina.  You develop uterine contractions (pelvic cramping, pain, or significant low back  pain).  You become weak or faint, or have uncontrolled vomiting after trauma.  You had a serious burn. This includes burns to the face, neck, hands, or genitals, or burns greater than the size of your palm anywhere else.  You develop neck stiffness or pain after a fall or from other trauma.  You develop a headache or vision problems after a fall or from other  trauma.  You do not feel the baby moving or the baby is not moving as much as before a fall or other trauma.   This information is not intended to replace advice given to you by your health care provider. Make sure you discuss any questions you have with your health care provider.   Document Released: 01/31/2004 Document Revised: 01/13/2014 Document Reviewed: 09/29/2012 Elsevier Interactive Patient Education 2016 [employer].  Syncope Syncope is a medical term for fainting or passing out. This means you lose consciousness and drop to the ground. People are generally unconscious for less than 5 minutes. You may have some muscle twitches for up to 15 seconds before waking up and returning to normal. Syncope occurs more often in older adults, but it can happen to anyone. While most causes of syncope are not dangerous, syncope can be a sign of a serious medical problem. It is important to seek medical care.  CAUSES  Syncope is caused by a sudden drop in blood flow to the brain. The specific cause is often not determined. Factors that can bring on syncope include:  Taking medicines that lower blood pressure.  Sudden changes in posture, such as standing up quickly.  Taking more medicine than prescribed.  Standing in one place for too long.  Seizure disorders.  Dehydration and excessive exposure to heat.  Low blood sugar (hypoglycemia).  Straining to have a bowel movement.  Heart disease, irregular heartbeat, or other circulatory problems.  Fear, emotional distress, seeing blood, or severe pain. SYMPTOMS  Right before fainting, you may:  Feel dizzy or light-headed.  Feel nauseous.  See all white or all black in your field of vision.  Have cold, clammy skin. DIAGNOSIS  Your health care provider will ask about your symptoms, perform a physical exam, and perform an electrocardiogram (ECG) to record the electrical activity of your heart. Your health care provider may also perform  other heart or blood tests to determine the cause of your syncope which may include:  Transthoracic echocardiogram (TTE). During echocardiography, sound waves are used to evaluate how blood flows through your heart.  Transesophageal echocardiogram (TEE).  Cardiac monitoring. This allows your health care provider to monitor your heart rate and rhythm in real time.  Holter monitor. This is a portable device that records your heartbeat and can help diagnose heart arrhythmias. It allows your health care provider to track your heart activity for several days, if needed.  Stress tests by exercise or by giving medicine that makes the heart beat faster. TREATMENT  In most cases, no treatment is needed. Depending on the cause of your syncope, your health care provider may recommend changing or stopping some of your medicines. HOME CARE INSTRUCTIONS  Have someone stay with you until you feel stable.  Do not drive, use machinery, or play sports until your health care provider says it is okay.  Keep all follow-up appointments as directed by your health care provider.  Lie down right away if you start feeling like you might faint. Breathe deeply and steadily. Wait until all the symptoms have passed.  Drink enough fluids to keep your urine clear or pale yellow.  If you are taking blood pressure or heart medicine, get up slowly and take several minutes to sit and then stand. This can reduce dizziness. SEEK IMMEDIATE MEDICAL CARE IF:   You have a severe headache.  You have unusual pain in the chest, abdomen, or back.  You are bleeding from your mouth or rectum, or you have black or tarry stool.  You have an irregular or very fast heartbeat.  You have pain with breathing.  You have repeated fainting or seizure-like jerking during an episode.  You faint when sitting or lying down.  You have confusion.  You have trouble walking.  You have severe weakness.  You have vision problems. If  you fainted, call your local emergency services (911 in [place] Do not drive yourself to the hospital.    This information is not intended to replace advice given to you by your health care provider. Make sure you discuss any questions you have with your health care provider.   Document Released: 12/23/2004 Document Revised: 05/09/2014 Document Reviewed: 02/21/2011 Elsevier Interactive Patient Education [employer]2016 Elsevier Inc.

## 2015-06-25 ENCOUNTER — Other Ambulatory Visit (HOSPITAL_COMMUNITY): Payer: Self-pay | Admitting: Obstetrics

## 2015-06-25 ENCOUNTER — Inpatient Hospital Stay (HOSPITAL_COMMUNITY)
Admission: AD | Admit: 2015-06-25 | Discharge: 2015-06-25 | Disposition: A | Discharge status: Home / Self Care | Payer: Medicaid Other | Source: Ambulatory Visit | Attending: Obstetrics | Admitting: Obstetrics

## 2015-06-25 ENCOUNTER — Encounter (HOSPITAL_COMMUNITY): Payer: Self-pay

## 2015-06-25 DIAGNOSIS — O212 Late vomiting of pregnancy: Secondary | ICD-10-CM | POA: Diagnosis not present

## 2015-06-25 DIAGNOSIS — K529 Noninfective gastroenteritis and colitis, unspecified: Secondary | ICD-10-CM

## 2015-06-25 DIAGNOSIS — Z3A30 30 weeks gestation of pregnancy: Secondary | ICD-10-CM | POA: Insufficient documentation

## 2015-06-25 DIAGNOSIS — R197 Diarrhea, unspecified: Secondary | ICD-10-CM | POA: Insufficient documentation

## 2015-06-25 DIAGNOSIS — Z3689 Encounter for other specified antenatal screening: Secondary | ICD-10-CM

## 2015-06-25 DIAGNOSIS — O99613 Diseases of the digestive system complicating pregnancy, third trimester: Secondary | ICD-10-CM | POA: Diagnosis not present

## 2015-06-25 DIAGNOSIS — Z3A31 31 weeks gestation of pregnancy: Secondary | ICD-10-CM

## 2015-06-25 DIAGNOSIS — O9989 Other specified diseases and conditions complicating pregnancy, childbirth and the puerperium: Secondary | ICD-10-CM | POA: Diagnosis not present

## 2015-06-25 DIAGNOSIS — R1110 Vomiting, unspecified: Secondary | ICD-10-CM | POA: Diagnosis present

## 2015-06-25 DIAGNOSIS — Z3403 Encounter for supervision of normal first pregnancy, third trimester: Secondary | ICD-10-CM

## 2015-06-25 LAB — URINE MICROSCOPIC-ADD ON: RBC / HPF: NONE SEEN RBC/hpf (ref 0–5)

## 2015-06-25 LAB — URINALYSIS, ROUTINE W REFLEX MICROSCOPIC
Bilirubin Urine: NEGATIVE
Glucose, UA: NEGATIVE mg/dL
Hgb urine dipstick: NEGATIVE
Ketones, ur: NEGATIVE mg/dL
NITRITE: NEGATIVE
Protein, ur: NEGATIVE mg/dL
SPECIFIC GRAVITY, URINE: 1.025 (ref 1.005–1.030)
pH: 6 (ref 5.0–8.0)

## 2015-06-25 LAB — CBC WITH DIFFERENTIAL/PLATELET
BASOS ABS: 0 10*3/uL (ref 0.0–0.1)
BASOS PCT: 0 %
EOS ABS: 0.6 10*3/uL (ref 0.0–0.7)
EOS PCT: 4 %
HCT: 32.6 % — ABNORMAL LOW (ref 36.0–46.0)
Hemoglobin: 10.8 g/dL — ABNORMAL LOW (ref 12.0–15.0)
Lymphocytes Relative: 8 %
Lymphs Abs: 1.4 10*3/uL (ref 0.7–4.0)
MCH: 27.1 pg (ref 26.0–34.0)
MCHC: 33.1 g/dL (ref 30.0–36.0)
MCV: 81.7 fL (ref 78.0–100.0)
Monocytes Absolute: 0.5 10*3/uL (ref 0.1–1.0)
Monocytes Relative: 3 %
Neutro Abs: 14.9 10*3/uL — ABNORMAL HIGH (ref 1.7–7.7)
Neutrophils Relative %: 85 %
PLATELETS: 292 10*3/uL (ref 150–400)
RBC: 3.99 MIL/uL (ref 3.87–5.11)
RDW: 13.5 % (ref 11.5–15.5)
WBC: 17.3 10*3/uL — AB (ref 4.0–10.5)

## 2015-06-25 LAB — COMPREHENSIVE METABOLIC PANEL
ALT: 18 U/L (ref 14–54)
AST: 20 U/L (ref 15–41)
Albumin: 3.4 g/dL — ABNORMAL LOW (ref 3.5–5.0)
Alkaline Phosphatase: 83 U/L (ref 38–126)
Anion gap: 7 (ref 5–15)
BUN: 7 mg/dL (ref 6–20)
CHLORIDE: 104 mmol/L (ref 101–111)
CO2: 23 mmol/L (ref 22–32)
Calcium: 8.9 mg/dL (ref 8.9–10.3)
Creatinine, Ser: 0.44 mg/dL (ref 0.44–1.00)
GFR calc Af Amer: >60 mL/min (ref >60)
Glucose, Bld: 84 mg/dL (ref 65–99)
POTASSIUM: 3.9 mmol/L (ref 3.5–5.1)
Sodium: 134 mmol/L — ABNORMAL LOW (ref 135–145)
Total Bilirubin: 0.2 mg/dL — ABNORMAL LOW (ref 0.3–1.2)
Total Protein: 6.5 g/dL (ref 6.5–8.1)

## 2015-06-25 LAB — TYPE AND SCREEN
ABO/RH(D): A POS
ANTIBODY SCREEN: NEGATIVE

## 2015-06-25 LAB — LIPASE, BLOOD: Lipase: 26 U/L (ref 11–51)

## 2015-06-25 MED ORDER — SODIUM CHLORIDE 0.9 % IV BOLUS (SEPSIS)
1000.0000 mL | Freq: Once | INTRAVENOUS | Status: AC
  Administered 2015-06-25: 1000 mL via INTRAVENOUS

## 2015-06-25 MED ORDER — PROMETHAZINE HCL 25 MG PO TABS: 25.0000 mg | ORAL_TABLET | Freq: Four times a day (QID) | ORAL | Status: DC | PRN

## 2015-06-25 MED ORDER — SODIUM CHLORIDE 0.9 % IV SOLN
8.0000 mg | Freq: Once | INTRAVENOUS | Status: AC
  Administered 2015-06-25: 8 mg via INTRAVENOUS
  Filled 2015-06-25: qty 4

## 2015-06-25 NOTE — Discharge Instructions (Signed)
Abdominal Pain During Pregnancy Abdominal pain is common in pregnancy. Most of the time, it does not cause harm. There are many causes of abdominal pain. Some causes are more serious than others. Some of the causes of abdominal pain in pregnancy are easily diagnosed. Occasionally, the diagnosis takes time to understand. Other times, the cause is not determined. Abdominal pain can be a sign that something is very wrong with the pregnancy, or the pain may have nothing to do with the pregnancy at all. For this reason, always tell your health care provider if you have any abdominal discomfort. HOME CARE INSTRUCTIONS  Monitor your abdominal pain for any changes. The following actions may help to alleviate any discomfort you are experiencing:  Do not have sexual intercourse or put anything in your vagina until your symptoms go away completely.  Get plenty of rest until your pain improves.  Drink clear fluids if you feel nauseous. Avoid solid food as long as you are uncomfortable or nauseous.  Only take over-the-counter or prescription medicine as directed by your health care provider.  Keep all follow-up appointments with your health care provider. SEEK IMMEDIATE MEDICAL CARE IF:  You are bleeding, leaking fluid, or passing tissue from the vagina.  You have increasing pain or cramping.  You have persistent vomiting.  You have painful or bloody urination.  You have a fever.  You notice a decrease in your baby's movements.  You have extreme weakness or feel faint.  You have shortness of breath, with or without abdominal pain.  You develop a severe headache with abdominal pain.  You have abnormal vaginal discharge with abdominal pain.  You have persistent diarrhea.  You have abdominal pain that continues even after rest, or gets worse. MAKE SURE YOU:   Understand these instructions.  Will watch your condition.  Will get help right away if you are not doing well or get worse.     This information is not intended to replace advice given to you by your health care provider. Make sure you discuss any questions you have with your health care provider.   Document Released: 12/23/2004 Document Revised: 10/13/2012 Document Reviewed: 07/22/2012 Elsevier Interactive Patient Education 2016 Elsevier Inc. Morning Sickness Morning sickness is when you feel sick to your stomach (nauseous) during pregnancy. You may feel sick to your stomach and throw up (vomit). You may feel sick in the morning, but you can feel this way any time of day. Some women feel very sick to their stomach and cannot stop throwing up (hyperemesis gravidarum). HOME CARE  Only take medicines as told by your doctor.  Take multivitamins as told by your doctor. Taking multivitamins before getting pregnant can stop or lessen the harshness of morning sickness.  Eat dry toast or unsalted crackers before getting out of bed.  Eat 5 to 6 small meals a day.  Eat dry and bland foods like rice and baked potatoes.  Do not drink liquids with meals. Drink between meals.  Do not eat greasy, fatty, or spicy foods.  Have someone cook for you if the smell of food causes you to feel sick or throw up.  If you feel sick to your stomach after taking prenatal vitamins, take them at night or with a snack.  Eat protein when you need a snack (nuts, yogurt, cheese).  Eat unsweetened gelatins for dessert.  Wear a bracelet used for sea sickness (acupressure wristband).  Go to a doctor that puts thin needles into certain body points (acupuncture)  to improve how you feel.  Do not smoke.  Use a humidifier to keep the air in your house free of odors.  Get lots of fresh air. GET HELP IF:  You need medicine to feel better.  You feel dizzy or lightheaded.  You are losing weight. GET HELP RIGHT AWAY IF:   You feel very sick to your stomach and cannot stop throwing up.  You pass out (faint). MAKE SURE YOU:  Understand  these instructions.  Will watch your condition.  Will get help right away if you are not doing well or get worse.   This information is not intended to replace advice given to you by your health care provider. Make sure you discuss any questions you have with your health care provider.   Document Released: 01/31/2004 Document Revised: 01/13/2014 Document Reviewed: 06/09/2012 Elsevier Interactive Patient Education 2016 [employer]. Viral Gastroenteritis Viral gastroenteritis is also known as stomach flu. This condition affects the stomach and intestinal tract. It can cause sudden diarrhea and vomiting. The illness typically lasts 3 to 8 days. Most people develop an immune response that eventually gets rid of the virus. While this natural response develops, the virus can make you quite ill. CAUSES  Many different viruses can cause gastroenteritis, such as rotavirus or noroviruses. You can catch one of these viruses by consuming contaminated food or water. You may also catch a virus by sharing utensils or other personal items with an infected person or by touching a contaminated surface. SYMPTOMS  The most common symptoms are diarrhea and vomiting. These problems can cause a severe loss of body fluids (dehydration) and a body salt (electrolyte) imbalance. Other symptoms may include:  Fever.  Headache.  Fatigue.  Abdominal pain. DIAGNOSIS  Your caregiver can usually diagnose viral gastroenteritis based on your symptoms and a physical exam. A stool sample may also be taken to test for the presence of viruses or other infections. TREATMENT  This illness typically goes away on its own. Treatments are aimed at rehydration. The most serious cases of viral gastroenteritis involve vomiting so severely that you are not able to keep fluids down. In these cases, fluids must be given through an intravenous line (IV). HOME CARE INSTRUCTIONS   Drink enough fluids to keep your urine clear or pale  yellow. Drink small amounts of fluids frequently and increase the amounts as tolerated.  Ask your caregiver for specific rehydration instructions.  Avoid:  Foods high in sugar.  Alcohol.  Carbonated drinks.  Tobacco.  Juice.  Caffeine drinks.  Extremely hot or cold fluids.  Fatty, greasy foods.  Too much intake of anything at one time.  Dairy products until 24 to 48 hours after diarrhea stops.  You may consume probiotics. Probiotics are active cultures of beneficial bacteria. They may lessen the amount and number of diarrheal stools in adults. Probiotics can be found in yogurt with active cultures and in supplements.  Wash your hands well to avoid spreading the virus.  Only take over-the-counter or prescription medicines for pain, discomfort, or fever as directed by your caregiver. Do not give aspirin to children. Antidiarrheal medicines are not recommended.  Ask your caregiver if you should continue to take your regular prescribed and over-the-counter medicines.  Keep all follow-up appointments as directed by your caregiver. SEEK IMMEDIATE MEDICAL CARE IF:   You are unable to keep fluids down.  You do not urinate at least once every 6 to 8 hours.  You develop shortness of breath.  You  notice blood in your stool or vomit. This may look like coffee grounds.  You have abdominal pain that increases or is concentrated in one small area (localized).  You have persistent vomiting or diarrhea.  You have a fever.  The patient is a child younger than 3 months, and he or she has a fever.  The patient is a child older than 3 months, and he or she has a fever and persistent symptoms.  The patient is a child older than 3 months, and he or she has a fever and symptoms suddenly get worse.  The patient is a baby, and he or she has no tears when crying. MAKE SURE YOU:   Understand these instructions.  Will watch your condition.  Will get help right away if you are not  doing well or get worse.   This information is not intended to replace advice given to you by your health care provider. Make sure you discuss any questions you have with your health care provider.   Document Released: 12/23/2004 Document Revised: 03/17/2011 Document Reviewed: 10/09/2010 Elsevier Interactive Patient Education [employer]

## 2015-06-25 NOTE — MAU Provider Note (Signed)
MAU HISTORY AND PHYSICAL  Chief Complaint:  Emesis   Alyssa Randall is a 20 y.o.  G1P0 with IUP at [redacted]w[redacted]d presenting for Emesis   New problem this pregnancy. Began 3 days ago. Nauseaus. Vomited 3-4 times in past 3 days. Vomited today. Tolerating liquids. Urinating with normal frequency. Loose bowel movements began same day. No known sick contacts. No fevers. No dysuria or hematuria. Occasionally has right upper quadrant pain with vomiting. None currently. No vaginal bleeding or lof. Positive fetal movements.   History reviewed. No pertinent past medical history.  Past Surgical History  Procedure Laterality Date  . Tooth extraction      History reviewed. No pertinent family history.  Social History  Substance Use Topics  . Smoking status: Never Smoker   . Smokeless tobacco: None  . Alcohol Use: No    No Known Allergies  Prescriptions prior to admission  Medication Sig Dispense Refill Last Dose  . ferrous sulfate 325 (65 FE) MG tablet Take 325 mg by mouth daily with breakfast.   Past Week at Unknown time  . ibuprofen (ADVIL,MOTRIN) 600 MG tablet Take 1 tablet (600 mg total) by mouth every 6 (six) hours as needed. 10 tablet 0 Past Week at Unknown time  . Prenatal Vit-Fe Fumarate-FA (PRENATAL MULTIVITAMIN) TABS tablet Take 1 tablet by mouth daily at 12 noon.   Past Week at Unknown time  . metroNIDAZOLE (FLAGYL) 500 MG tablet Take 1 tablet (500 mg total) by mouth 2 (two) times daily. (Patient not taking: Reported on 06/25/2015) 14 tablet 0 Completed Course at Unknown time  . oxyCODONE-acetaminophen (PERCOCET/ROXICET) 5-325 MG tablet Take 1-2 tablets by mouth every 6 (six) hours as needed for severe pain. (Patient not taking: Reported on 06/25/2015) 6 tablet 0 Not Taking at Unknown time    Review of Systems - Negative except for what is mentioned in HPI.  Physical Exam  Blood pressure 115/66, pulse 85, temperature 98.8 F (37.1 C), temperature source Oral, resp. rate 18, weight  138 lb 1.3 oz (62.633 kg), last menstrual period 11/21/2014. GENERAL: Well-developed, well-nourished female in no acute distress.  LUNGS: Clear to auscultation bilaterally.  HEART: Regular rate and rhythm. ABDOMEN: Soft, nontender, nondistended, gravid.  EXTREMITIES: Nontender, no edema, 2+ distal pulses. 125/mod/+a/-d   Labs: Results for orders placed or performed during the hospital encounter of 06/25/15 (from the past 24 hour(s))  Urinalysis, Routine w reflex microscopic (not at [facility])   Collection Time: 06/25/15  5:20 PM  Result Value Ref Range   Color, Urine YELLOW YELLOW   APPearance HAZY (A) CLEAR   Specific Gravity, Urine 1.025 1.005 - 1.030   pH 6.0 5.0 - 8.0   Glucose, UA NEGATIVE NEGATIVE mg/dL   Hgb urine dipstick NEGATIVE NEGATIVE   Bilirubin Urine NEGATIVE NEGATIVE   Ketones, ur NEGATIVE NEGATIVE mg/dL   Protein, ur NEGATIVE NEGATIVE mg/dL   Nitrite NEGATIVE NEGATIVE   Leukocytes, UA MODERATE (A) NEGATIVE  Urine microscopic-add on   Collection Time: 06/25/15  5:20 PM  Result Value Ref Range   Squamous Epithelial / LPF 0-5 (A) NONE SEEN   WBC, UA 6-30 0 - 5 WBC/hpf   RBC / HPF NONE SEEN 0 - 5 RBC/hpf   Bacteria, UA FEW (A) NONE SEEN   Urine-Other MUCOUS PRESENT   Comprehensive metabolic panel   Collection Time: 06/25/15  6:15 PM  Result Value Ref Range   Sodium 134 (L) 135 - 145 mmol/L   Potassium 3.9 3.5 - 5.1 mmol/L  Chloride 104 101 - 111 mmol/L   CO2 23 22 - 32 mmol/L   Glucose, Bld 84 65 - 99 mg/dL   BUN 7 6 - 20 mg/dL   Creatinine, Ser 0.980.44 0.44 - 1.00 mg/dL   Calcium 8.9 8.9 - 11.910.3 mg/dL   Total Protein 6.5 6.5 - 8.1 g/dL   Albumin 3.4 (L) 3.5 - 5.0 g/dL   AST 20 15 - 41 U/L   ALT 18 14 - 54 U/L   Alkaline Phosphatase 83 38 - 126 U/L   Total Bilirubin 0.2 (L) 0.3 - 1.2 mg/dL   GFR calc non Af Amer >60 >60 mL/min   GFR calc Af Amer >60 >60 mL/min   Anion gap 7 5 - 15  Lipase, blood   Collection Time: 06/25/15  6:15 PM  Result Value Ref Range    Lipase 26 11 - 51 U/L  CBC with Differential/Platelet   Collection Time: 06/25/15  6:15 PM  Result Value Ref Range   WBC 17.3 (H) 4.0 - 10.5 K/uL   RBC 3.99 3.87 - 5.11 MIL/uL   Hemoglobin 10.8 (L) 12.0 - 15.0 g/dL   HCT 14.732.6 (L) 82.936.0 - 56.246.0 %   MCV 81.7 78.0 - 100.0 fL   MCH 27.1 26.0 - 34.0 pg   MCHC 33.1 30.0 - 36.0 g/dL   RDW 13.013.5 86.511.5 - 78.415.5 %   Platelets 292 150 - 400 K/uL   Neutrophils Relative % 85 %   Neutro Abs 14.9 (H) 1.7 - 7.7 K/uL   Lymphocytes Relative 8 %   Lymphs Abs 1.4 0.7 - 4.0 K/uL   Monocytes Relative 3 %   Monocytes Absolute 0.5 0.1 - 1.0 K/uL   Eosinophils Relative 4 %   Eosinophils Absolute 0.6 0.0 - 0.7 K/uL   Basophils Relative 0 %   Basophils Absolute 0.0 0.0 - 0.1 K/uL    Imaging Studies:  No results found.  Assessment: Alyssa Randall is  20 y.o. G1P0 at 603w6d presents with Emesis Here well appearing, normal vitals, not appearing dehydrated. Likely gastroenteritis given new onset of symptoms and association w/ diarrhea. Symptoms much improved with one dose zofran and iv fluids. She had her boyfriend purchase potato chips from our vending machine and ate the whole bag. Labs unremarkable. Says has had intermittent ruq pain but none today and lfts/lipase/bili wnl, so certainly do not think cholecystitis, but biliary colic is possible. nst reactive  Plan: - push fluids - dehydration and abdominal pain return precautions - phenergan  Cherrie Gauzeoah B Asuncion [place]ott 6/19/20176:58 PM

## 2015-06-25 NOTE — MAU Note (Signed)
Been throwing up since Friday, can't keep anything down.  Had been having pain in RUQ, waiting for gallbladder US to be approved. Has been having diarrhea for 2 wks.

## 2015-06-27 ENCOUNTER — Encounter (HOSPITAL_COMMUNITY): Payer: Self-pay | Admitting: Obstetrics

## 2015-06-27 ENCOUNTER — Other Ambulatory Visit (HOSPITAL_COMMUNITY): Payer: Self-pay | Admitting: Obstetrics

## 2015-06-27 DIAGNOSIS — R1011 Right upper quadrant pain: Secondary | ICD-10-CM

## 2015-06-27 LAB — CULTURE, OB URINE

## 2015-07-04 ENCOUNTER — Encounter (HOSPITAL_COMMUNITY): Payer: Self-pay

## 2015-07-04 ENCOUNTER — Ambulatory Visit (HOSPITAL_COMMUNITY)
Admission: RE | Admit: 2015-07-04 | Discharge: 2015-07-04 | Disposition: A | Discharge status: Home / Self Care | Payer: Medicaid Other | Source: Ambulatory Visit | Attending: Obstetrics | Admitting: Obstetrics

## 2015-07-04 ENCOUNTER — Other Ambulatory Visit (HOSPITAL_COMMUNITY): Payer: Self-pay | Admitting: Obstetrics

## 2015-07-04 DIAGNOSIS — Z36 Encounter for antenatal screening of mother: Secondary | ICD-10-CM | POA: Insufficient documentation

## 2015-07-04 DIAGNOSIS — Z3A31 31 weeks gestation of pregnancy: Secondary | ICD-10-CM

## 2015-07-04 DIAGNOSIS — Z3689 Encounter for other specified antenatal screening: Secondary | ICD-10-CM

## 2015-07-04 DIAGNOSIS — O4443 Low lying placenta NOS or without hemorrhage, third trimester: Secondary | ICD-10-CM | POA: Insufficient documentation

## 2015-07-04 DIAGNOSIS — Z3403 Encounter for supervision of normal first pregnancy, third trimester: Secondary | ICD-10-CM

## 2015-07-04 NOTE — ED Notes (Signed)
Pt reports no fetal movement x2 days 

## 2015-07-05 ENCOUNTER — Encounter: Payer: Medicaid Other | Admitting: Certified Nurse Midwife

## 2015-07-11 ENCOUNTER — Ambulatory Visit (HOSPITAL_COMMUNITY)
Admission: RE | Admit: 2015-07-11 | Discharge: 2015-07-11 | Disposition: A | Discharge status: Home / Self Care | Payer: Medicaid Other | Source: Ambulatory Visit | Attending: Obstetrics | Admitting: Obstetrics

## 2015-07-11 DIAGNOSIS — R1011 Right upper quadrant pain: Secondary | ICD-10-CM | POA: Diagnosis present

## 2015-07-19 ENCOUNTER — Ambulatory Visit (INDEPENDENT_AMBULATORY_CARE_PROVIDER_SITE_OTHER): Payer: Medicaid Other | Admitting: Obstetrics & Gynecology

## 2015-07-19 VITALS — BP 121/82 | HR 89 | Wt 141.0 lb

## 2015-07-19 DIAGNOSIS — Z3493 Encounter for supervision of normal pregnancy, unspecified, third trimester: Secondary | ICD-10-CM | POA: Diagnosis not present

## 2015-07-19 DIAGNOSIS — Z3403 Encounter for supervision of normal first pregnancy, third trimester: Secondary | ICD-10-CM | POA: Insufficient documentation

## 2015-07-19 NOTE — Progress Notes (Signed)
Subjective:  Alyssa ClientZakyia Randall is a 20 y.o. G1P0 at 1422w2d being seen today for ongoing prenatal care; she is a transfer from Dr. Elsie StainMarshall's office.  She is currently monitored for the following issues for this low-risk pregnancy and has Supervision of normal pregnancy on her problem list.  Patient reports no complaints.  Contractions: Not present. Vag. Bleeding: None.  Movement: Present. Denies leaking of fluid.   The following portions of the patient's history were reviewed and updated as appropriate: allergies, current medications, past family history, past medical history, past social history, past surgical history and problem list. Problem list updated.  Objective:   Filed Vitals:   07/19/15 0814  BP: 121/82  Pulse: 89  Weight: 141 lb (63.957 kg)    Fetal Status: Fetal Heart Rate (bpm): 140 Fundal Height: 34 cm Movement: Present     General:  Alert, oriented and cooperative. Patient is in no acute distress.  Skin: Skin is warm and dry. No rash noted.   Cardiovascular: Normal heart rate noted  Respiratory: Normal respiratory effort, no problems with respiration noted  Abdomen: Soft, gravid, appropriate for gestational age. Pain/Pressure: Absent     Pelvic:  Cervical exam deferred        Extremities: Normal range of motion.  Edema: None  Mental Status: Normal mood and affect. Normal behavior. Normal judgment and thought content.   Urinalysis: Urine Protein: Negative Urine Glucose: Negative  Assessment and Plan:  Pregnancy: G1P0 at 2422w2d  Supervision of normal pregnancy, third trimester Offered TDap vaccine, patient will consider this and decide by next visit. The nature of [place] [facility] with multiple MDs and other Advanced Practice Providers was explained to patient; also emphasized that residents, students are part of our team. Preterm labor symptoms and general obstetric precautions including but not limited to vaginal bleeding, contractions,  leaking of fluid and fetal movement were reviewed in detail with the patient. Please refer to After Visit Summary for other counseling recommendations.  Return in about 2 weeks (around 08/02/2015) for OB Visit, Pelvic cultures.   [name], MD

## 2015-07-19 NOTE — Patient Instructions (Signed)
Return to clinic for any scheduled appointments or obstetric concerns, or go to [facility] for evaluation  Tdap Vaccine (Tetanus, Diphtheria and Pertussis): What You Need to Know 1. Why get vaccinated? Tetanus, diphtheria and pertussis are very serious diseases. Tdap vaccine can protect us from these diseases. And, Tdap vaccine given to pregnant women can protect newborn babies against pertussis. TETANUS (Lockjaw) is rare in the [place] today. It causes painful muscle tightening and stiffness, usually all over the body.  It can lead to tightening of muscles in the head and neck so you can't open your mouth, swallow, or sometimes even breathe. Tetanus kills about 1 out of 10 people who are infected even after receiving the best medical care. DIPHTHERIA is also rare in the [place] today. It can cause a thick coating to form in the back of the throat.  It can lead to breathing problems, heart failure, paralysis, and death. PERTUSSIS (Whooping Cough) causes severe coughing spells, which can cause difficulty breathing, vomiting and disturbed sleep.  It can also lead to weight loss, incontinence, and rib fractures. Up to 2 in 100 adolescents and 5 in 100 adults with pertussis are hospitalized or have complications, which could include pneumonia or death. These diseases are caused by bacteria. Diphtheria and pertussis are spread from person to person through secretions from coughing or sneezing. Tetanus enters the body through cuts, scratches, or wounds. Before vaccines, as many as 200,000 cases of diphtheria, 200,000 cases of pertussis, and hundreds of cases of tetanus, were reported in the [place] each year. Since vaccination began, reports of cases for tetanus and diphtheria have dropped by about 99% and for pertussis by about 80%. 2. Tdap vaccine Tdap vaccine can protect adolescents and adults from tetanus, diphtheria, and pertussis. One dose of Tdap is routinely given at age 11 or 12.  People who did not get Tdap at that age should get it as soon as possible. Tdap is especially important for healthcare professionals and anyone having close contact with a baby younger than 12 months. Pregnant women should get a dose of Tdap during every pregnancy, to protect the newborn from pertussis. Infants are most at risk for severe, life-threatening complications from pertussis. Another vaccine, called Td, protects against tetanus and diphtheria, but not pertussis. A Td booster should be given every 10 years. Tdap may be given as one of these boosters if you have never gotten Tdap before. Tdap may also be given after a severe cut or burn to prevent tetanus infection. Your doctor or the person giving you the vaccine can give you more information. Tdap may safely be given at the same time as other vaccines. 3. Some people should not get this vaccine  A person who has ever had a life-threatening allergic reaction after a previous dose of any diphtheria, tetanus or pertussis containing vaccine, OR has a severe allergy to any part of this vaccine, should not get Tdap vaccine. Tell the person giving the vaccine about any severe allergies.  Anyone who had coma or long repeated seizures within 7 days after a childhood dose of DTP or DTaP, or a previous dose of Tdap, should not get Tdap, unless a cause other than the vaccine was found. They can still get Td.  Talk to your doctor if you:  have seizures or another nervous system problem,  had severe pain or swelling after any vaccine containing diphtheria, tetanus or pertussis,  ever had a condition called Guillain-Barr Syndrome (GBS),  aren't feeling   well on the day the shot is scheduled. 4. Risks With any medicine, including vaccines, there is a chance of side effects. These are usually mild and go away on their own. Serious reactions are also possible but are rare. Most people who get Tdap vaccine do not have any problems with it. Mild  problems following Tdap (Did not interfere with activities)  Pain where the shot was given (about 3 in 4 adolescents or 2 in 3 adults)  Redness or swelling where the shot was given (about 1 person in 5)  Mild fever of at least 100.4F (up to about 1 in 25 adolescents or 1 in 100 adults)  Headache (about 3 or 4 people in 10)  Tiredness (about 1 person in 3 or 4)  Nausea, vomiting, diarrhea, stomach ache (up to 1 in 4 adolescents or 1 in 10 adults)  Chills, sore joints (about 1 person in 10)  Body aches (about 1 person in 3 or 4)  Rash, swollen glands (uncommon) Moderate problems following Tdap (Interfered with activities, but did not require medical attention)  Pain where the shot was given (up to 1 in 5 or 6)  Redness or swelling where the shot was given (up to about 1 in 16 adolescents or 1 in 12 adults)  Fever over 102F (about 1 in 100 adolescents or 1 in 250 adults)  Headache (about 1 in 7 adolescents or 1 in 10 adults)  Nausea, vomiting, diarrhea, stomach ache (up to 1 or 3 people in 100)  Swelling of the entire arm where the shot was given (up to about 1 in 500). Severe problems following Tdap (Unable to perform usual activities; required medical attention)  Swelling, severe pain, bleeding and redness in the arm where the shot was given (rare). Problems that could happen after any vaccine:  People sometimes faint after a medical procedure, including vaccination. Sitting or lying down for about 15 minutes can help prevent fainting, and injuries caused by a fall. Tell your doctor if you feel dizzy, or have vision changes or ringing in the ears.  Some people get severe pain in the shoulder and have difficulty moving the arm where a shot was given. This happens very rarely.  Any medication can cause a severe allergic reaction. Such reactions from a vaccine are very rare, estimated at fewer than 1 in a million doses, and would happen within a few minutes to a few hours  after the vaccination. As with any medicine, there is a very remote chance of a vaccine causing a serious injury or death. The safety of vaccines is always being monitored. For more information, visit: www.cdc.gov/vaccinesafety/ 5. What if there is a serious problem? What should I look for?  Look for anything that concerns you, such as signs of a severe allergic reaction, very high fever, or unusual behavior.  Signs of a severe allergic reaction can include hives, swelling of the face and throat, difficulty breathing, a fast heartbeat, dizziness, and weakness. These would usually start a few minutes to a few hours after the vaccination. What should I do?  If you think it is a severe allergic reaction or other emergency that can't wait, call 9-1-1 or get the person to the nearest hospital. Otherwise, call your doctor.  Afterward, the reaction should be reported to the Vaccine Adverse Event Reporting System (VAERS). Your doctor might file this report, or you can do it yourself through the VAERS web site at www.vaers.hhs.gov, or by calling 1-800-822-7967. VAERS does not   give medical advice.  6. The National Vaccine Injury Compensation Program The National Vaccine Injury Compensation Program (VICP) is a federal program that was created to compensate people who may have been injured by certain vaccines. Persons who believe they may have been injured by a vaccine can learn about the program and about filing a claim by calling 1-800-338-2382 or visiting the VICP website at www.hrsa.gov/vaccinecompensation. There is a time limit to file a claim for compensation. 7. How can I learn more?  Ask your doctor. He or she can give you the vaccine package insert or suggest other sources of information.  Call your local or state health department.  Contact the Centers for Disease Control and Prevention (CDC):  Call 1-800-232-4636 (1-800-CDC-INFO) or  Visit CDC's website at www.cdc.gov/vaccines CDC Tdap  Vaccine VIS (03/01/13)   This information is not intended to replace advice given to you by your health care provider. Make sure you discuss any questions you have with your health care provider.   Document Released: 06/24/2011 Document Revised: 01/13/2014 Document Reviewed: 04/06/2013 Elsevier Interactive Patient Education 2016 Elsevier Inc.  

## 2015-07-26 ENCOUNTER — Encounter: Payer: Self-pay | Admitting: *Deleted

## 2015-08-02 ENCOUNTER — Ambulatory Visit (INDEPENDENT_AMBULATORY_CARE_PROVIDER_SITE_OTHER): Payer: Medicaid Other | Admitting: Obstetrics and Gynecology

## 2015-08-02 VITALS — BP 116/79 | HR 88 | Wt 142.0 lb

## 2015-08-02 DIAGNOSIS — Z3493 Encounter for supervision of normal pregnancy, unspecified, third trimester: Secondary | ICD-10-CM

## 2015-08-02 DIAGNOSIS — Z3403 Encounter for supervision of normal first pregnancy, third trimester: Secondary | ICD-10-CM

## 2015-08-02 NOTE — Progress Notes (Signed)
Patient has no concerns today. 

## 2015-08-02 NOTE — Progress Notes (Signed)
Prenatal Visit Note Date: 08/02/2015 Clinic: [facility]  Subjective:  Alyssa Randall is a 20 y.o. G1P0 at [redacted]w[redacted]d being seen today for ongoing prenatal care.  She is currently monitored for the following issues for this low-risk pregnancy and has Supervision of normal first pregnancy in third trimester on her problem list.  Patient reports no complaints.   Contractions: Not present. Vag. Bleeding: None.  Movement: Present. Denies leaking of fluid.   The following portions of the patient's history were reviewed and updated as appropriate: allergies, current medications, past family history, past medical history, past social history, past surgical history and problem list. Problem list updated.  Objective:   Vitals:   08/02/15 0856  BP: 116/79  Pulse: 88  Weight: 142 lb (64.4 kg)    Fetal Status:     Movement: Present     General:  Alert, oriented and cooperative. Patient is in no acute distress.  Skin: Skin is warm and dry. No rash noted.   Cardiovascular: Normal heart rate noted  Respiratory: Normal respiratory effort, no problems with respiration noted  Abdomen: Soft, gravid, appropriate for gestational age. Pain/Pressure: Present     Pelvic:  Cervical exam deferred        Extremities: Normal range of motion.  Edema: None  Mental Status: Normal mood and affect. Normal behavior. Normal judgment and thought content.   Urinalysis: Urine Protein: Negative Urine Glucose: Negative  Assessment and Plan:  Pregnancy: G1P0 at [redacted]w[redacted]d  1. Supervision of normal first pregnancy in third trimester nexplanon - Culture, beta strep (group b only) - GC/Chlamydia Probe Amp  Preterm labor symptoms and general obstetric precautions including but not limited to vaginal bleeding, contractions, leaking of fluid and fetal movement were reviewed in detail with the patient. Please refer to After Visit Summary for other counseling recommendations.   7-10d ROB  [place] Bing, MD

## 2015-08-05 LAB — OB RESULTS CONSOLE GBS: GBS: POSITIVE

## 2015-08-05 LAB — CULTURE, BETA STREP (GROUP B ONLY): Strep Gp B Culture: POSITIVE — AB

## 2015-08-06 ENCOUNTER — Encounter: Payer: Self-pay | Admitting: Obstetrics and Gynecology

## 2015-08-06 DIAGNOSIS — O99820 Streptococcus B carrier state complicating pregnancy: Secondary | ICD-10-CM | POA: Insufficient documentation

## 2015-08-08 ENCOUNTER — Ambulatory Visit (INDEPENDENT_AMBULATORY_CARE_PROVIDER_SITE_OTHER): Payer: Medicaid Other | Admitting: Obstetrics and Gynecology

## 2015-08-08 VITALS — BP 122/80 | HR 102 | Temp 98.6°F | Wt 142.5 lb

## 2015-08-08 DIAGNOSIS — Z1389 Encounter for screening for other disorder: Secondary | ICD-10-CM | POA: Diagnosis not present

## 2015-08-08 DIAGNOSIS — O99820 Streptococcus B carrier state complicating pregnancy: Secondary | ICD-10-CM

## 2015-08-08 DIAGNOSIS — Z331 Pregnant state, incidental: Secondary | ICD-10-CM

## 2015-08-08 DIAGNOSIS — Z3403 Encounter for supervision of normal first pregnancy, third trimester: Secondary | ICD-10-CM | POA: Diagnosis not present

## 2015-08-08 LAB — POCT URINALYSIS DIPSTICK
Bilirubin, UA: NEGATIVE
Glucose, UA: NEGATIVE
NITRITE UA: NEGATIVE
RBC UA: NEGATIVE
SPEC GRAV UA: 1.015
UROBILINOGEN UA: 0.2
pH, UA: 5

## 2015-08-08 LAB — GC/CHLAMYDIA PROBE AMP
CHLAMYDIA, DNA PROBE: NEGATIVE
NEISSERIA GONORRHOEAE BY PCR: NEGATIVE

## 2015-08-08 NOTE — Progress Notes (Signed)
Subjective:  Alyssa Randall is a 20 y.o. G1P0 at [redacted]w[redacted]d being seen today for ongoing prenatal care.  She is currently monitored for the following issues for this high-risk pregnancy and has Supervision of normal first pregnancy in third trimester and GBS (group B Streptococcus carrier), +RV culture, currently pregnant on her problem list.  Patient reports vaginal spotting yesterday. No bleeding today.  Contractions: Not present. Vag. Bleeding: Small.  Movement: (!) Decreased. Denies leaking of fluid.   The following portions of the patient's history were reviewed and updated as appropriate: allergies, current medications, past family history, past medical history, past social history, past surgical history and problem list. Problem list updated.  Objective:   Vitals:   08/08/15 1320  BP: 122/80  Pulse: (!) 102  Temp: 98.6 F (37 C)  Weight: 142 lb 8 oz (64.6 kg)    Fetal Status: Fetal Heart Rate (bpm): 150 Fundal Height: 36 cm Movement: (!) Decreased  Presentation: Vertex  General:  Alert, oriented and cooperative. Patient is in no acute distress.  Skin: Skin is warm and dry. No rash noted.   Cardiovascular: Normal heart rate noted  Respiratory: Normal respiratory effort, no problems with respiration noted  Abdomen: Soft, gravid, appropriate for gestational age. Pain/Pressure: Present     Pelvic:  Cervical exam performed Dilation: Fingertip Effacement (%): Thick Station: Ballotable  Extremities: Normal range of motion.  Edema: None  Mental Status: Normal mood and affect. Normal behavior. Normal judgment and thought content.   Urinalysis:      Assessment and Plan:  Pregnancy: G1P0 at [redacted]w[redacted]d  1. Encounter for supervision of normal first pregnancy in third trimester Reassurance provided No bleeding noted on exam vaginal exam today - POCT Urinalysis Dipstick  2. Supervision of normal first pregnancy in third trimester   3. GBS (group B Streptococcus carrier), +RV culture,  currently pregnant Will need prophylaxis in labor  Term labor symptoms and general obstetric precautions including but not limited to vaginal bleeding, contractions, leaking of fluid and fetal movement were reviewed in detail with the patient. Please refer to After Visit Summary for other counseling recommendations.  No Follow-up on file.   Catalina Antigua, MD

## 2015-08-08 NOTE — Progress Notes (Signed)
Pt c/o bright red blood after wiping vagina yesterday, pain and pressure in vagina

## 2015-08-09 ENCOUNTER — Encounter: Payer: Medicaid Other | Admitting: Obstetrics and Gynecology

## 2015-08-20 ENCOUNTER — Ambulatory Visit (INDEPENDENT_AMBULATORY_CARE_PROVIDER_SITE_OTHER): Payer: Medicaid Other | Admitting: Obstetrics & Gynecology

## 2015-08-20 VITALS — BP 113/80 | HR 81 | Temp 97.7°F | Wt 145.9 lb

## 2015-08-20 DIAGNOSIS — Z331 Pregnant state, incidental: Secondary | ICD-10-CM

## 2015-08-20 DIAGNOSIS — Z1389 Encounter for screening for other disorder: Secondary | ICD-10-CM | POA: Diagnosis not present

## 2015-08-20 DIAGNOSIS — O99820 Streptococcus B carrier state complicating pregnancy: Secondary | ICD-10-CM

## 2015-08-20 DIAGNOSIS — Z3403 Encounter for supervision of normal first pregnancy, third trimester: Secondary | ICD-10-CM | POA: Diagnosis not present

## 2015-08-20 LAB — POCT URINALYSIS DIPSTICK
Bilirubin, UA: NEGATIVE
Glucose, UA: NEGATIVE
KETONES UA: NEGATIVE
Nitrite, UA: NEGATIVE
SPEC GRAV UA: 1.025
UROBILINOGEN UA: 0.2
pH, UA: 6

## 2015-08-20 NOTE — Progress Notes (Signed)
Subjective:  Alyssa Randall is a 20 y.o. G1P0 at 4671w6d being seen today for ongoing prenatal care.  She is currently monitored for the following issues for this low-risk pregnancy and has Supervision of normal first pregnancy in third trimester and GBS (group B Streptococcus carrier), +RV culture, currently pregnant on her problem list.  Patient reports occasional contractions and vaginal pressure.  Contractions: Not present. Vag. Bleeding: None.  Movement: Present. Denies leaking of fluid.   The following portions of the patient's history were reviewed and updated as appropriate: allergies, current medications, past family history, past medical history, past social history, past surgical history and problem list. Problem list updated.  Objective:   Vitals:   08/20/15 1132  BP: 113/80  Pulse: 81  Temp: 97.7 F (36.5 C)  Weight: 145 lb 14.4 oz (66.2 kg)    Fetal Status: Fetal Heart Rate (bpm): 140's Fundal Height: 38 cm Movement: Present  Presentation: Vertex  General:  Alert, oriented and cooperative. Patient is in no acute distress.  Skin: Skin is warm and dry. No rash noted.   Cardiovascular: Normal heart rate noted  Respiratory: Normal respiratory effort, no problems with respiration noted  Abdomen: Soft, gravid, appropriate for gestational age. Pain/Pressure: Present     Pelvic:  Cervical exam performed Dilation: Fingertip Effacement (%): Thick Station: Ballotable  Extremities: Normal range of motion.  Edema: None  Mental Status: Normal mood and affect. Normal behavior. Normal judgment and thought content.   Urinalysis: Urine Protein: 1+ Urine Glucose: Negative  Assessment and Plan:  Pregnancy: G1P0 at 8371w6d  1. Encounter for supervision of normal first pregnancy in third trimester  - POCT Urinalysis Dipstick  2. Supervision of normal first pregnancy in third trimester   3. GBS (group B Streptococcus carrier), +RV culture, currently pregnant Reviewed with pt need for  tx in labor.  Term labor symptoms and general obstetric precautions including but not limited to vaginal bleeding, contractions, leaking of fluid and fetal movement were reviewed in detail with the patient. Please refer to After Visit Summary for other counseling recommendations.  Return in about 1 week (around 08/27/2015).   Willodean Rosenthalarolyn Harraway-Smith, MD

## 2015-08-20 NOTE — Progress Notes (Signed)
Pt c/o vaginal pressure and would like cervix checked today.

## 2015-08-22 ENCOUNTER — Encounter: Payer: Medicaid Other | Admitting: Obstetrics and Gynecology

## 2015-08-29 ENCOUNTER — Ambulatory Visit (INDEPENDENT_AMBULATORY_CARE_PROVIDER_SITE_OTHER): Payer: Medicaid Other | Admitting: Obstetrics and Gynecology

## 2015-08-29 VITALS — BP 125/82 | HR 98 | Temp 98.7°F | Wt 147.4 lb

## 2015-08-29 DIAGNOSIS — Z3403 Encounter for supervision of normal first pregnancy, third trimester: Secondary | ICD-10-CM

## 2015-08-29 DIAGNOSIS — O99820 Streptococcus B carrier state complicating pregnancy: Secondary | ICD-10-CM

## 2015-08-29 DIAGNOSIS — Z1389 Encounter for screening for other disorder: Secondary | ICD-10-CM | POA: Diagnosis not present

## 2015-08-29 DIAGNOSIS — Z331 Pregnant state, incidental: Secondary | ICD-10-CM | POA: Diagnosis not present

## 2015-08-29 LAB — POCT URINALYSIS DIPSTICK
Bilirubin, UA: NEGATIVE
Glucose, UA: NEGATIVE
Ketones, UA: NEGATIVE
Nitrite, UA: NEGATIVE
RBC UA: NEGATIVE
SPEC GRAV UA: 1.025
UROBILINOGEN UA: 1
pH, UA: 5

## 2015-08-29 NOTE — Progress Notes (Signed)
Pt denies concerns at this time. 

## 2015-08-29 NOTE — Progress Notes (Signed)
Subjective:  Alyssa Randall is a 20 y.o. G1P0 at 4570w1d being seen today for ongoing prenatal care.  She is currently monitored for the following issues for this low-risk pregnancy and has Supervision of normal first pregnancy in third trimester and GBS (group B Streptococcus carrier), +RV culture, currently pregnant on her problem list.  Patient reports no complaints.  Contractions: Not present. Vag. Bleeding: None.  Movement: Present. Denies leaking of fluid.   The following portions of the patient's history were reviewed and updated as appropriate: allergies, current medications, past family history, past medical history, past social history, past surgical history and problem list. Problem list updated.  Objective:   Vitals:   08/29/15 1249  BP: 125/82  Pulse: 98  Temp: 98.7 F (37.1 C)  Weight: 147 lb 6.4 oz (66.9 kg)    Fetal Status: Fetal Heart Rate (bpm): 136 Fundal Height: 39 cm Movement: Present  Presentation: Vertex  General:  Alert, oriented and cooperative. Patient is in no acute distress.  Skin: Skin is warm and dry. No rash noted.   Cardiovascular: Normal heart rate noted  Respiratory: Normal respiratory effort, no problems with respiration noted  Abdomen: Soft, gravid, appropriate for gestational age. Pain/Pressure: Absent     Pelvic:  Cervical exam performed Dilation: Fingertip Effacement (%): Thick Station: Ballotable  Extremities: Normal range of motion.  Edema: None  Mental Status: Normal mood and affect. Normal behavior. Normal judgment and thought content.   Urinalysis:      Assessment and Plan:  Pregnancy: G1P0 at 6070w1d  1. Encounter for supervision of normal first pregnancy in third trimester - POCT Urinalysis Dipstick  2. Supervision of normal first pregnancy in third trimester Patient is doing well Will schedule for postdate fetal testing on 8/25 and IOL on 8/29  3. GBS (group B Streptococcus carrier), +RV culture, currently pregnant Will receive  prophylaxis in labor  Term labor symptoms and general obstetric precautions including but not limited to vaginal bleeding, contractions, leaking of fluid and fetal movement were reviewed in detail with the patient. Please refer to After Visit Summary for other counseling recommendations.  Return for NST and AFI.   Catalina AntiguaPeggy Vickee Mormino, MD

## 2015-08-29 NOTE — Addendum Note (Signed)
Addended by: Catalina AntiguaONSTANT, Romie Tay on: 08/29/2015 01:39 PM   Modules accepted: Orders

## 2015-08-30 ENCOUNTER — Telehealth (HOSPITAL_COMMUNITY): Payer: Self-pay | Admitting: *Deleted

## 2015-08-30 NOTE — Telephone Encounter (Signed)
Preadmission screen  

## 2015-08-31 ENCOUNTER — Encounter (HOSPITAL_COMMUNITY): Payer: Self-pay | Admitting: *Deleted

## 2015-08-31 ENCOUNTER — Encounter: Payer: Self-pay | Admitting: Certified Nurse Midwife

## 2015-08-31 ENCOUNTER — Ambulatory Visit (INDEPENDENT_AMBULATORY_CARE_PROVIDER_SITE_OTHER): Payer: Medicaid Other | Admitting: Certified Nurse Midwife

## 2015-08-31 ENCOUNTER — Inpatient Hospital Stay (HOSPITAL_COMMUNITY)
Admission: AD | Admit: 2015-08-31 | Discharge: 2015-08-31 | Disposition: A | Discharge status: Home / Self Care | Payer: Medicaid Other | Source: Ambulatory Visit | Attending: Obstetrics & Gynecology | Admitting: Obstetrics & Gynecology

## 2015-08-31 VITALS — BP 112/78 | HR 96 | Wt 147.0 lb

## 2015-08-31 DIAGNOSIS — Z3A40 40 weeks gestation of pregnancy: Secondary | ICD-10-CM | POA: Diagnosis not present

## 2015-08-31 DIAGNOSIS — Z79899 Other long term (current) drug therapy: Secondary | ICD-10-CM | POA: Insufficient documentation

## 2015-08-31 DIAGNOSIS — Z3403 Encounter for supervision of normal first pregnancy, third trimester: Secondary | ICD-10-CM | POA: Insufficient documentation

## 2015-08-31 DIAGNOSIS — O480 Post-term pregnancy: Secondary | ICD-10-CM | POA: Diagnosis not present

## 2015-08-31 DIAGNOSIS — IMO0002 Reserved for concepts with insufficient information to code with codable children: Secondary | ICD-10-CM

## 2015-08-31 HISTORY — DX: Unspecified infectious disease: B99.9

## 2015-08-31 LAB — POCT URINALYSIS DIPSTICK
BILIRUBIN UA: NEGATIVE
Glucose, UA: NEGATIVE
KETONES UA: NEGATIVE
Nitrite, UA: NEGATIVE
PH UA: 5
RBC UA: NEGATIVE
Spec Grav, UA: 1.025
Urobilinogen, UA: NEGATIVE

## 2015-08-31 NOTE — Progress Notes (Signed)
Pt states good FM and no ctx.

## 2015-08-31 NOTE — Discharge Instructions (Signed)
Nonstress Test  The nonstress test is a procedure that monitors the fetus's heartbeat. The test will monitor the heartbeat when the fetus is at rest and while the fetus is moving. In a healthy fetus, there will be an increase in fetal heart rate when the fetus moves or kicks. The heart rate will decrease at rest. This test helps determine if the fetus is healthy. Your health care provider will look at a number of patterns in the heart rate tracing to make sure your baby is thriving. If there is concern, your health care provider may order additional tests or may suggest another course of action. This test is often done in the third trimester and can help determine if an early delivery is needed and safe. Common reasons to have this test are:  · You are past your due date.  · You have a high-risk pregnancy.  · You are feeling less movement than normal.  · You have lost a pregnancy in the past.  · Your health care provider suspects fetal growth problems.  · You have too much or too little amniotic fluid.  BEFORE THE PROCEDURE  · Eat a meal right before the test or as directed by your health care provider. Food may help stimulate fetal movements.  · Use the restroom right before the test.  PROCEDURE  · Two belts will be placed around your abdomen. These belts have monitors attached to them. One records the fetal heart rate and the other records uterine contractions.  · You may be asked to lie down on your side or to stay sitting upright.  · You may be given a button to press when you feel movement.  · The fetal heartbeat is listened to and watched on a screen. The heartbeat is recorded on a sheet of paper.  · If the fetus seems to be sleeping, you may be asked to drink some juice or soda, gently press your abdomen, or make some noise to wake the fetus.  AFTER THE PROCEDURE   Your health care provider will discuss the test results with you and make recommendations for the near future.     This information is not  intended to replace advice given to you by your health care provider. Make sure you discuss any questions you have with your health care provider.     Document Released: 12/13/2001 Document Revised: 01/13/2014 Document Reviewed: 01/27/2012  Elsevier Interactive Patient Education ©2016 [employer]vier Inc.

## 2015-08-31 NOTE — MAU Provider Note (Signed)
Chief Complaint:  non-reactive fetal tracing   None    HPI  Alyssa Randall is a 20 y.o. G1P0 at 4840w3dwho presents to maternity admissions reporting nonreactive nonstress test in office today.  Patient does feel movement.  Not having much pain. She reports good fetal movement, denies LOF, vaginal bleeding, vaginal itching/burning, urinary symptoms, h/a, dizziness, n/v, diarrhea, constipation or fever/chills.  She denies headache, visual changes or RUQ abdominal pain.  RN Note: Sent from office, pt is post dates- tracing was non-reactive. Sent for further eval  Past Medical History: Past Medical History:  Diagnosis Date  . Infection    UTI    Past obstetric history: OB History  Gravida Para Term Preterm AB Living  1         0  SAB TAB Ectopic Multiple Live Births               # Outcome Date GA Lbr Len/2nd Weight Sex Delivery Anes PTL Lv  1 Current               Past Surgical History: Past Surgical History:  Procedure Laterality Date  . NO PAST SURGERIES    . TOOTH EXTRACTION      Family History: Family History  Problem Relation Age of Onset  . Diabetes Father     Social History: Social History  Substance Use Topics  . Smoking status: Never Smoker  . Smokeless tobacco: Never Used  . Alcohol use No    Allergies: No Known Allergies  Meds:  Prescriptions Prior to Admission  Medication Sig Dispense Refill Last Dose  . ferrous sulfate 325 (65 FE) MG tablet Take 325 mg by mouth daily with breakfast.   Taking  . Prenatal Vit-Fe Fumarate-FA (PRENATAL MULTIVITAMIN) TABS tablet Take 1 tablet by mouth daily at 12 noon.   Taking  . promethazine (PHENERGAN) 25 MG tablet Take 1 tablet (25 mg total) by mouth every 6 (six) hours as needed for nausea or vomiting. 30 tablet 2 Taking    I have reviewed patient's Past Medical Hx, Surgical Hx, Family Hx, Social Hx, medications and allergies.   ROS:  Review of Systems  Constitutional: Negative for chills and fever.   Gastrointestinal: Negative for abdominal pain, constipation, diarrhea, nausea and vomiting.  Genitourinary: Negative for dysuria, vaginal bleeding and vaginal discharge.  Musculoskeletal: Negative for back pain.   Other systems negative  Physical Exam  No data found.  Constitutional: Well-developed, well-nourished female in no acute distress.  Cardiovascular: normal rate and rhythm Respiratory: normal effort, clear to auscultation bilaterally GI: Abd soft, non-tender, gravid appropriate for gestational age.   No rebound or guarding. MS: Extremities nontender, no edema, normal ROM Neurologic: Alert and oriented x 4.  GU: Neg CVAT.     FHT:  Baseline 140 , moderate variability, accelerations present, no decelerations Contractions:  Irregular    Labs: Results for orders placed or performed in visit on 08/31/15 (from the past 24 hour(s))  POCT urinalysis dipstick     Status: Abnormal   Collection Time: 08/31/15 11:29 AM  Result Value Ref Range   Color, UA golden    Clarity, UA clear    Glucose, UA neg    Bilirubin, UA neg    Ketones, UA neg    Spec Grav, UA 1.025    Blood, UA neg    pH, UA 5.0    Protein, UA trace    Urobilinogen, UA negative    Nitrite, UA neg    Leukocytes,  UA moderate (2+) (A) Negative   --/--/A POS (06/19 1815)  Imaging:  No results found.  MAU Course/MDM:  Treatments in MAU included NST NST is reactive Labor precautions reviewed.    Assessment: 1. Supervision of normal first pregnancy in third trimester   2.     Nonreactive nonstress test, now reactive,, Category I  Plan: Discharge home Labor precautions and fetal kick counts Follow up in Office for prenatal visits and recheck of status   Pt stable at time of discharge.  Encouraged to return here or to other Urgent Care/ED if she develops worsening of symptoms, increase in pain, fever, or other concerning symptoms.     Wynelle Bourgeois CNM, MSN Certified  Nurse-Midwife 08/31/2015 11:42 AM

## 2015-08-31 NOTE — Progress Notes (Signed)
Subjective:    Alyssa Randall is a 20 y.o. female being seen today for her obstetrical visit. She is at 6692w3d gestation. Patient reports no complaints. Fetal movement: normal.  Has induction scheduled for Tuesday.    Problem List Items Addressed This Visit    None    Visit Diagnoses    Encounter for supervision of normal first pregnancy in third trimester    -  Primary   Relevant Orders   POCT urinalysis dipstick (Completed)     Patient Active Problem List   Diagnosis Date Noted  . GBS (group B Streptococcus carrier), +RV culture, currently pregnant 08/06/2015  . Supervision of normal first pregnancy in third trimester 07/19/2015    Objective:    BP 112/78   Pulse 96   Wt 147 lb (66.7 kg)   LMP 11/21/2014   BMI 28.71 kg/m  FHT:  145 BPM  Uterine Size: 40 cm and size equals dates  Presentation: cephalic  Pelvic Exam:              Dilation: Closed       Effacement: Long   Station:  -3     Consistency: firm            Position: posterior    NST: no accels, no decels, moderate variability, Cat. 1 tracing. occasional contractions on toco.   Assessment:    Pregnancy @ 5492w3d  weeks   Non reactive NST  Contractions on Toco at term: patient does not feel them Plan:   Sent to MAU for evaluation of non-reactive NST Postdates management: discussed fetal surveillance and induction, discussed fetal movement, NST non-reactive, biophysical profile ordered. Induction: scheduled for Tuesday, written information given.  Follow up in 2 weeks postpartum.

## 2015-08-31 NOTE — MAU Note (Signed)
Urine sent to lab 

## 2015-08-31 NOTE — MAU Note (Signed)
Sent from office, pt is post dates- tracing was non-reactive. Sent for further eval

## 2015-09-03 ENCOUNTER — Ambulatory Visit (HOSPITAL_COMMUNITY)
Admission: RE | Admit: 2015-09-03 | Discharge: 2015-09-03 | Disposition: A | Discharge status: Home / Self Care | Payer: Medicaid Other | Source: Ambulatory Visit | Attending: Obstetrics and Gynecology | Admitting: Obstetrics and Gynecology

## 2015-09-03 ENCOUNTER — Encounter (HOSPITAL_COMMUNITY): Payer: Self-pay

## 2015-09-03 ENCOUNTER — Other Ambulatory Visit: Payer: Self-pay | Admitting: Obstetrics and Gynecology

## 2015-09-03 DIAGNOSIS — Z3403 Encounter for supervision of normal first pregnancy, third trimester: Secondary | ICD-10-CM

## 2015-09-03 DIAGNOSIS — O480 Post-term pregnancy: Secondary | ICD-10-CM

## 2015-09-03 DIAGNOSIS — Z3A40 40 weeks gestation of pregnancy: Secondary | ICD-10-CM

## 2015-09-04 ENCOUNTER — Inpatient Hospital Stay (HOSPITAL_COMMUNITY)
Admission: RE | Admit: 2015-09-04 | Discharge: 2015-09-06 | DRG: 775 | Disposition: A | Discharge status: Home / Self Care | Payer: Medicaid Other | Source: Ambulatory Visit | Attending: Obstetrics and Gynecology | Admitting: Obstetrics and Gynecology

## 2015-09-04 ENCOUNTER — Encounter (HOSPITAL_COMMUNITY): Payer: Self-pay

## 2015-09-04 ENCOUNTER — Inpatient Hospital Stay (HOSPITAL_COMMUNITY): Payer: Medicaid Other | Admitting: Anesthesiology

## 2015-09-04 DIAGNOSIS — Z833 Family history of diabetes mellitus: Secondary | ICD-10-CM | POA: Diagnosis not present

## 2015-09-04 DIAGNOSIS — Z3403 Encounter for supervision of normal first pregnancy, third trimester: Secondary | ICD-10-CM

## 2015-09-04 DIAGNOSIS — Z3A41 41 weeks gestation of pregnancy: Secondary | ICD-10-CM | POA: Diagnosis not present

## 2015-09-04 DIAGNOSIS — O99824 Streptococcus B carrier state complicating childbirth: Secondary | ICD-10-CM | POA: Diagnosis present

## 2015-09-04 DIAGNOSIS — O480 Post-term pregnancy: Secondary | ICD-10-CM | POA: Diagnosis present

## 2015-09-04 LAB — CBC
HEMATOCRIT: 32 % — AB (ref 36.0–46.0)
Hemoglobin: 10.5 g/dL — ABNORMAL LOW (ref 12.0–15.0)
MCH: 25.4 pg — ABNORMAL LOW (ref 26.0–34.0)
MCHC: 32.8 g/dL (ref 30.0–36.0)
MCV: 77.5 fL — AB (ref 78.0–100.0)
PLATELETS: 298 10*3/uL (ref 150–400)
RBC: 4.13 MIL/uL (ref 3.87–5.11)
RDW: 15.8 % — ABNORMAL HIGH (ref 11.5–15.5)
WBC: 13.6 10*3/uL — AB (ref 4.0–10.5)

## 2015-09-04 LAB — TYPE AND SCREEN
ABO/RH(D): A POS
ANTIBODY SCREEN: NEGATIVE

## 2015-09-04 LAB — RPR: RPR Ser Ql: NONREACTIVE

## 2015-09-04 MED ORDER — LACTATED RINGERS IV SOLN: 500.0000 mL | INTRAVENOUS | Status: DC | PRN

## 2015-09-04 MED ORDER — LACTATED RINGERS IV SOLN
INTRAVENOUS | Status: DC
  Administered 2015-09-04 (×2): via INTRAVENOUS

## 2015-09-04 MED ORDER — MISOPROSTOL 25 MCG QUARTER TABLET
25.0000 ug | ORAL_TABLET | ORAL | Status: DC | PRN
  Administered 2015-09-04: 25 ug via VAGINAL
  Filled 2015-09-04: qty 0.25
  Filled 2015-09-04: qty 1

## 2015-09-04 MED ORDER — TERBUTALINE SULFATE 1 MG/ML IJ SOLN: 0.2500 mg | Freq: Once | INTRAMUSCULAR | Status: DC | PRN

## 2015-09-04 MED ORDER — OXYTOCIN BOLUS FROM INFUSION: 500.0000 mL | Freq: Once | INTRAVENOUS | Status: DC

## 2015-09-04 MED ORDER — PHENYLEPHRINE 40 MCG/ML (10ML) SYRINGE FOR IV PUSH (FOR BLOOD PRESSURE SUPPORT)
80.0000 ug | PREFILLED_SYRINGE | INTRAVENOUS | Status: DC | PRN
  Filled 2015-09-04 (×2): qty 10
  Filled 2015-09-04: qty 5

## 2015-09-04 MED ORDER — OXYTOCIN 40 UNITS IN LACTATED RINGERS INFUSION - SIMPLE MED
1.0000 m[IU]/min | INTRAVENOUS | Status: DC
  Administered 2015-09-04: 2 m[IU]/min via INTRAVENOUS

## 2015-09-04 MED ORDER — OXYCODONE-ACETAMINOPHEN 5-325 MG PO TABS: 2.0000 | ORAL_TABLET | ORAL | Status: DC | PRN

## 2015-09-04 MED ORDER — FLEET ENEMA 7-19 GM/118ML RE ENEM: 1.0000 | ENEMA | RECTAL | Status: DC | PRN

## 2015-09-04 MED ORDER — PENICILLIN G POTASSIUM 5000000 UNITS IJ SOLR
5.0000 10*6.[IU] | Freq: Once | INTRAVENOUS | Status: AC
  Administered 2015-09-04: 5 10*6.[IU] via INTRAVENOUS
  Filled 2015-09-04: qty 5

## 2015-09-04 MED ORDER — EPHEDRINE 5 MG/ML INJ
10.0000 mg | INTRAVENOUS | Status: DC | PRN
  Filled 2015-09-04: qty 4

## 2015-09-04 MED ORDER — OXYCODONE-ACETAMINOPHEN 5-325 MG PO TABS: 1.0000 | ORAL_TABLET | ORAL | Status: DC | PRN

## 2015-09-04 MED ORDER — LACTATED RINGERS IV SOLN: 500.0000 mL | Freq: Once | INTRAVENOUS | Status: DC

## 2015-09-04 MED ORDER — DIPHENHYDRAMINE HCL 50 MG/ML IJ SOLN: 12.5000 mg | INTRAMUSCULAR | Status: DC | PRN

## 2015-09-04 MED ORDER — ONDANSETRON HCL 4 MG/2ML IJ SOLN
4.0000 mg | Freq: Four times a day (QID) | INTRAMUSCULAR | Status: DC | PRN
  Administered 2015-09-04: 4 mg via INTRAVENOUS
  Filled 2015-09-04: qty 2

## 2015-09-04 MED ORDER — PENICILLIN G POTASSIUM 5000000 UNITS IJ SOLR
2.5000 10*6.[IU] | INTRAVENOUS | Status: DC
  Administered 2015-09-04 (×2): 2.5 10*6.[IU] via INTRAVENOUS
  Filled 2015-09-04 (×8): qty 2.5

## 2015-09-04 MED ORDER — OXYTOCIN 40 UNITS IN LACTATED RINGERS INFUSION - SIMPLE MED
2.5000 [IU]/h | INTRAVENOUS | Status: DC
  Filled 2015-09-04: qty 1000

## 2015-09-04 MED ORDER — LIDOCAINE HCL (PF) 1 % IJ SOLN
INTRAMUSCULAR | Status: DC | PRN
  Administered 2015-09-04 (×2): 5 mL via EPIDURAL

## 2015-09-04 MED ORDER — ACETAMINOPHEN 325 MG PO TABS: 650.0000 mg | ORAL_TABLET | ORAL | Status: DC | PRN

## 2015-09-04 MED ORDER — TERBUTALINE SULFATE 1 MG/ML IJ SOLN
0.2500 mg | Freq: Once | INTRAMUSCULAR | Status: DC | PRN
Start: 2015-09-04 — End: 2015-09-05
  Filled 2015-09-04: qty 1

## 2015-09-04 MED ORDER — FENTANYL 2.5 MCG/ML BUPIVACAINE 1/10 % EPIDURAL INFUSION (WH - ANES)
14.0000 mL/h | INTRAMUSCULAR | Status: DC | PRN
  Administered 2015-09-04: 14 mL/h via EPIDURAL
  Filled 2015-09-04 (×2): qty 125

## 2015-09-04 MED ORDER — TERBUTALINE SULFATE 1 MG/ML IJ SOLN
0.2500 mg | Freq: Once | INTRAMUSCULAR | Status: DC | PRN
  Filled 2015-09-04: qty 1

## 2015-09-04 MED ORDER — FENTANYL CITRATE (PF) 100 MCG/2ML IJ SOLN
100.0000 ug | INTRAMUSCULAR | Status: DC | PRN
  Administered 2015-09-04 (×4): 100 ug via INTRAVENOUS
  Filled 2015-09-04 (×5): qty 2

## 2015-09-04 MED ORDER — SOD CITRATE-CITRIC ACID 500-334 MG/5ML PO SOLN: 30.0000 mL | ORAL | Status: DC | PRN

## 2015-09-04 MED ORDER — LIDOCAINE HCL (PF) 1 % IJ SOLN
30.0000 mL | INTRAMUSCULAR | Status: DC | PRN
  Filled 2015-09-04: qty 30

## 2015-09-04 MED ORDER — PHENYLEPHRINE 40 MCG/ML (10ML) SYRINGE FOR IV PUSH (FOR BLOOD PRESSURE SUPPORT)
80.0000 ug | PREFILLED_SYRINGE | INTRAVENOUS | Status: DC | PRN
Start: 2015-09-04 — End: 2015-09-05
  Filled 2015-09-04: qty 5

## 2015-09-04 MED ORDER — LACTATED RINGERS IV SOLN
INTRAVENOUS | Status: DC
  Administered 2015-09-04: via INTRAUTERINE

## 2015-09-04 NOTE — Progress Notes (Signed)
Labor Progress Note Alyssa Randall is a 20 y.o. G1P0 at 5875w0d presented for IOL 2/2 postdates S: Patient doing well. Just had an epidural   O:  BP (!) 102/56   Pulse (!) 116   Temp 98.6 F (37 C) (Axillary)   Resp 16   Ht 5\' 3"  (1.6 m)   Wt 68 kg (150 lb)   LMP 11/21/2014   SpO2 100%   BMI 26.57 kg/m  EFM: 130/minmal/accels not present/ late decels  CVE: Dilation: 5.5 Effacement (%): 80 Cervical Position: Middle Station: 0 Presentation: Vertex Exam by:: Lanice ShirtsV Rogers RN    A&P: 20 y.o. G1P0 7075w0d  IOL for postdates  #Labor: IUPC placed, continue pitocin  #Pain: Just received epidural  #FWB: Category 2  #GBS positive  [name], MD 9:35 PM

## 2015-09-04 NOTE — H&P (Signed)
LABOR AND DELIVERY ADMISSION HISTORY AND PHYSICAL NOTE  Alyssa Randall is a 20 y.o. female G1P0 with IUP at 1846w0d by LMP presenting for IOL for postdates.   She reports positive fetal movement. She denies leakage of fluid or vaginal bleeding.  Prenatal History/Complications:  Past Medical History: Past Medical History:  Diagnosis Date  . Infection    UTI    Past Surgical History: Past Surgical History:  Procedure Laterality Date  . NO PAST SURGERIES    . TOOTH EXTRACTION      Obstetrical History: OB History    Gravida Para Term Preterm AB Living   1         0   SAB TAB Ectopic Multiple Live Births                  Social History: Social History   Social History  . Marital status: Single    Spouse name: N/A  . Number of children: N/A  . Years of education: N/A   Social History Main Topics  . Smoking status: Never Smoker  . Smokeless tobacco: Never Used  . Alcohol use No  . Drug use: No  . Sexual activity: Yes    Birth control/ protection: None   Other Topics Concern  . None   Social History Narrative  . None    Family History: Family History  Problem Relation Age of Onset  . Diabetes Father     Allergies: No Known Allergies  Prescriptions Prior to Admission  Medication Sig Dispense Refill Last Dose  . ferrous sulfate 325 (65 FE) MG tablet Take 325 mg by mouth daily with breakfast.   09/03/2015  . Prenatal Vit-Fe Fumarate-FA (PRENATAL MULTIVITAMIN) TABS tablet Take 1 tablet by mouth daily at 12 noon.   09/03/2015  . promethazine (PHENERGAN) 25 MG tablet Take 1 tablet (25 mg total) by mouth every 6 (six) hours as needed for nausea or vomiting. 30 tablet 2 09/03/2015     Review of Systems   All systems reviewed and negative except as stated in HPI  Temperature 97.7 F (36.5 C), temperature source Axillary, last menstrual period 11/21/2014. General appearance: alert, cooperative and no distress Lungs: no respiratory distress Heart: regular  rate Abdomen: soft, non-tender Extremities: No calf swelling or tenderness Presentation: cephalic by cervical exam Fetal monitoring: 135 baseline, moderate variability, +accelerations, no decelerations Uterine activity: irreg Dilation: 1 Effacement (%): 60 Station: -3 Exam by:: Janine OresSusie Nix, RN   Prenatal labs: ABO, Rh: --/--/A POS (06/19 1815) Antibody: NEG (06/19 1815) Rubella: immune RPR: Nonreactive, Nonreactive, NONREACTIVE (02/16 0000)  HBsAg: Negative (02/16 0000)  HIV: Non Reactive (03/11 1211)  GBS: Positive (07/30 0000)  Genetic screening:  declined Anatomy US: low lying placenta at 18wks -> resolved at 31wks  Prenatal Transfer Tool  Maternal Diabetes: No Genetic Screening: Declined Maternal Ultrasounds/Referrals: Abnormal:  Findings:   Other: low lying placenta at 18 wks -> resolved at 31 wks Fetal Ultrasounds or other Referrals:  None Maternal Substance Abuse:  No Significant Maternal Medications:  None Significant Maternal Lab Results: Lab values include: Group B Strep positive  Results for orders placed or performed during the hospital encounter of 09/04/15 (from the past 24 hour(s))  CBC   Collection Time: 09/04/15  8:00 AM  Result Value Ref Range   WBC 13.6 (H) 4.0 - 10.5 K/uL   RBC 4.13 3.87 - 5.11 MIL/uL   Hemoglobin 10.5 (L) 12.0 - 15.0 g/dL   HCT 16.132.0 (L) 09.636.0 - 04.546.0 %  MCV 77.5 (L) 78.0 - 100.0 fL   MCH 25.4 (L) 26.0 - 34.0 pg   MCHC 32.8 30.0 - 36.0 g/dL   RDW 16.1 (H) 09.6 - 04.5 %   Platelets 298 150 - 400 K/uL    Patient Active Problem List   Diagnosis Date Noted  . Post-dates pregnancy 09/04/2015  . GBS (group B Streptococcus carrier), +RV culture, currently pregnant 08/06/2015  . Supervision of normal first pregnancy in third trimester 07/19/2015    Assessment: Alyssa Randall is a 20 y.o. G1P0 at [redacted]w[redacted]d here for IOL for postdates  #Labor:cytotec #Pain: Nitrous oxide #FWB: Category I #ID:  GBS pos #MOF:  breast #MOC:nexplanon #Circ:  Outpatient   Leland Her, DO PGY-1 8/29/20179:16 AM   OB FELLOW HISTORY AND PHYSICAL ATTESTATION  I have seen and examined this patient; I agree with above documentation in the resident's note.    Ernestina Penna 09/04/2015, 10:25 AM

## 2015-09-04 NOTE — Progress Notes (Signed)
LABOR PROGRESS NOTE  Alyssa Randall is a 20 y.o. G1P0 at 464w0d admitted for IOL for postdates  Subjective: Doing well, amenable to foley bulb  Objective: BP 113/70 (BP Location: Left Arm)   Pulse 95   Temp 97.6 F (36.4 C) (Oral)   Resp 16   LMP 11/21/2014   SpO2 100%  or  Vitals:   09/04/15 0748 09/04/15 1200  BP: 107/68 113/70  Pulse:  95  Resp: 16   Temp: 97.7 F (36.5 C) 97.6 F (36.4 C)  TempSrc: Axillary Oral  SpO2:  100%    Dilation: 1 Effacement (%): 60 Cervical Position: Posterior Station: -3 Presentation: Vertex Exam by:: Janine OresSusie Nix, RN 2/70/-3 by myself FHT: 140 baseline, moderate variability, + accelerations, no decelerations Uterine activity: q1-753min  Labs: Lab Results  Component Value Date   WBC 13.6 (H) 09/04/2015   HGB 10.5 (L) 09/04/2015   HCT 32.0 (L) 09/04/2015   MCV 77.5 (L) 09/04/2015   PLT 298 09/04/2015    Patient Active Problem List   Diagnosis Date Noted  . Post-dates pregnancy 09/04/2015  . GBS (group B Streptococcus carrier), +RV culture, currently pregnant 08/06/2015  . Supervision of normal first pregnancy in third trimester 07/19/2015    Assessment / Plan: 20 y.o. G1P0 at 514w0d here for IOL for postdates, now SROM'd to light mec and FB placed  Labor: foley bulb placed  Fetal Wellbeing:  Category I Pain Control:  Planning for nitrous oxide Anticipated MOD:  SVD  Leland HerElsia J Parley Pidcock, DO PGY-1 8/29/201712:47 PM

## 2015-09-04 NOTE — Progress Notes (Signed)
Pt had variable decelerations s/p epidural. BP 102/58. IUPC placed and pitocin stopped temporarily. Will restart at later point when HR improved. Cervix 5.5/80/-1/

## 2015-09-04 NOTE — Anesthesia Preprocedure Evaluation (Signed)
Anesthesia Evaluation  Patient identified by MRN, date of birth, ID band Patient awake    Reviewed: Allergy & Precautions, NPO status , Patient's Chart, lab work & pertinent test results  History of Anesthesia Complications Negative for: history of anesthetic complications  Airway Mallampati: II  TM Distance: >3 FB Neck ROM: Full    Dental no notable dental hx.    Pulmonary neg pulmonary ROS,    Pulmonary exam normal        Cardiovascular negative cardio ROS Normal cardiovascular exam     Neuro/Psych negative neurological ROS  negative psych ROS   GI/Hepatic negative GI ROS, Neg liver ROS,   Endo/Other  negative endocrine ROS  Renal/GU negative Renal ROS     Musculoskeletal   Abdominal   Peds  Hematology   Anesthesia Other Findings   Reproductive/Obstetrics (+) Pregnancy                             Anesthesia Physical Anesthesia Plan  ASA: II  Anesthesia Plan: Epidural   Post-op Pain Management:    Induction:   Airway Management Planned: Natural Airway  Additional Equipment:   Intra-op Plan:   Post-operative Plan:   Informed Consent: I have reviewed the patients History and Physical, chart, labs and discussed the procedure including the risks, benefits and alternatives for the proposed anesthesia with the patient or authorized representative who has indicated his/her understanding and acceptance.   Dental advisory given  Plan Discussed with: Anesthesiologist  Anesthesia Plan Comments:         Anesthesia Quick Evaluation

## 2015-09-04 NOTE — Anesthesia Procedure Notes (Signed)
Epidural Patient location during procedure: OB Start time: 09/04/2015 8:22 PM End time: 09/04/2015 8:39 PM  Staffing Anesthesiologist: Heather RobertsSINGER, Rollande Thursby Performed: anesthesiologist   Preanesthetic Checklist Completed: patient identified, site marked, pre-op evaluation, timeout performed, IV checked, risks and benefits discussed and monitors and equipment checked  Epidural Patient position: sitting Prep: DuraPrep Patient monitoring: heart rate, cardiac monitor, continuous pulse ox and blood pressure Approach: midline Location: L2-L3 Injection technique: LOR saline  Needle:  Needle type: Tuohy  Needle gauge: 17 G Needle length: 9 cm Needle insertion depth: 5 cm Catheter size: 20 Guage Catheter at skin depth: 10 cm Test dose: negative and Other  Assessment Events: blood not aspirated, injection not painful, no injection resistance and negative IV test  Additional Notes Informed consent obtained prior to proceeding including risk of failure, 1% risk of PDPH, risk of minor discomfort and bruising.  Discussed rare but serious complications including epidural abscess, permanent nerve injury, epidural hematoma.  Discussed alternatives to epidural analgesia and patient desires to proceed.  Timeout performed pre-procedure verifying patient name, procedure, and platelet count.  Patient tolerated procedure well.

## 2015-09-04 NOTE — Anesthesia Pain Management Evaluation Note (Signed)
  CRNA Pain Management Visit Note  Patient: Alyssa Randall, 20 y.o., female  "Hello I am a [facility] the anesthesia [facility]. We have an anesthesia team available at all times to provide care throughout the hospital, including epidural management and anesthesia for C-section. I don't know your plan for the delivery whether it a natural birth, water birth, IV sedation, nitrous supplementation, doula or epidural, but we want to meet your pain goals."   1.Was your pain managed to your expectations on prior hospitalizations?   No prior hospitalizations  2.What is your expectation for pain management during this hospitalization?     Labor support without medications  3.How can we help you reach that goal? Patient wants to try a natural childbirth.  Options discussed with patient including N2O, IV pain meds, and epidural.  Questions answered.  Record the patient's initial score and the patient's pain goal.   Pain: 0  Pain Goal: 8 [facility] wants you to be able to say your pain was always managed very well.  Ihor Meinzer L 09/04/2015

## 2015-09-05 ENCOUNTER — Encounter (HOSPITAL_COMMUNITY): Payer: Self-pay

## 2015-09-05 DIAGNOSIS — O99824 Streptococcus B carrier state complicating childbirth: Secondary | ICD-10-CM

## 2015-09-05 DIAGNOSIS — O480 Post-term pregnancy: Secondary | ICD-10-CM

## 2015-09-05 DIAGNOSIS — Z3A41 41 weeks gestation of pregnancy: Secondary | ICD-10-CM

## 2015-09-05 MED ORDER — COCONUT OIL OIL
1.0000 "application " | TOPICAL_OIL | Status: DC | PRN
  Filled 2015-09-05: qty 120

## 2015-09-05 MED ORDER — SENNOSIDES-DOCUSATE SODIUM 8.6-50 MG PO TABS
2.0000 | ORAL_TABLET | ORAL | Status: DC
  Administered 2015-09-06: 2 via ORAL
  Filled 2015-09-05: qty 2

## 2015-09-05 MED ORDER — IBUPROFEN 600 MG PO TABS
600.0000 mg | ORAL_TABLET | Freq: Four times a day (QID) | ORAL | Status: DC
  Administered 2015-09-05 – 2015-09-06 (×4): 600 mg via ORAL
  Filled 2015-09-05 (×6): qty 1

## 2015-09-05 MED ORDER — ONDANSETRON HCL 4 MG/2ML IJ SOLN: 4.0000 mg | INTRAMUSCULAR | Status: DC | PRN

## 2015-09-05 MED ORDER — ACETAMINOPHEN 325 MG PO TABS
650.0000 mg | ORAL_TABLET | ORAL | Status: DC | PRN
  Administered 2015-09-05 (×2): 650 mg via ORAL
  Filled 2015-09-05 (×2): qty 2

## 2015-09-05 MED ORDER — OXYCODONE HCL 5 MG PO TABS: 5.0000 mg | ORAL_TABLET | ORAL | Status: DC | PRN

## 2015-09-05 MED ORDER — TETANUS-DIPHTH-ACELL PERTUSSIS 5-2.5-18.5 LF-MCG/0.5 IM SUSP: 0.5000 mL | Freq: Once | INTRAMUSCULAR | Status: DC

## 2015-09-05 MED ORDER — SIMETHICONE 80 MG PO CHEW: 80.0000 mg | CHEWABLE_TABLET | ORAL | Status: DC | PRN

## 2015-09-05 MED ORDER — DIBUCAINE 1 % RE OINT: 1.0000 "application " | TOPICAL_OINTMENT | RECTAL | Status: DC | PRN

## 2015-09-05 MED ORDER — ZOLPIDEM TARTRATE 5 MG PO TABS: 5.0000 mg | ORAL_TABLET | Freq: Every evening | ORAL | Status: DC | PRN

## 2015-09-05 MED ORDER — ONDANSETRON HCL 4 MG PO TABS: 4.0000 mg | ORAL_TABLET | ORAL | Status: DC | PRN

## 2015-09-05 MED ORDER — DIPHENHYDRAMINE HCL 25 MG PO CAPS: 25.0000 mg | ORAL_CAPSULE | Freq: Four times a day (QID) | ORAL | Status: DC | PRN

## 2015-09-05 MED ORDER — WITCH HAZEL-GLYCERIN EX PADS: 1.0000 "application " | MEDICATED_PAD | CUTANEOUS | Status: DC | PRN

## 2015-09-05 MED ORDER — BENZOCAINE-MENTHOL 20-0.5 % EX AERO
1.0000 "application " | INHALATION_SPRAY | CUTANEOUS | Status: DC | PRN
  Filled 2015-09-05: qty 56

## 2015-09-05 MED ORDER — FERROUS SULFATE 325 (65 FE) MG PO TABS
325.0000 mg | ORAL_TABLET | Freq: Every day | ORAL | Status: DC
  Administered 2015-09-06: 325 mg via ORAL
  Filled 2015-09-05: qty 1

## 2015-09-05 MED ORDER — PRENATAL MULTIVITAMIN CH
1.0000 | ORAL_TABLET | Freq: Every day | ORAL | Status: DC
  Administered 2015-09-05 – 2015-09-06 (×2): 1 via ORAL
  Filled 2015-09-05 (×2): qty 1

## 2015-09-05 NOTE — Lactation Note (Signed)
This note was copied from a baby's chart. Lactation Consultation Note Initial visit with mom, baby now 8513 hours old. Baby asleep skin to skin after bath with mom. She reports he has had a couple of good feedings today. Reports no pain with latch only tugging. Reviewed feeding cues and encouraged to feed whenever she sees them. Mom states RN showed her hand expression.  BF brochure given. Reviewed our phone number, OP appointments and BFSG as resources for assist after DC. No questions at present. To call for assist prn Patient Name: Alyssa Randall [id number]'s Date: 09/05/2015 Reason for consult: Initial assessment   Maternal Data Formula Feeding for Exclusion: No Has patient been taught Hand Expression?: Yes Does the patient have breastfeeding experience prior to this delivery?: No  Feeding    LATCH Score/Interventions                      Lactation Tools Discussed/Used     Consult Status Consult Status: Follow-up Date: 09/06/15 Follow-up type: In-patient    Pamelia HoitWeeks, Erminia Mcnew D 09/05/2015, 2:07 PM

## 2015-09-05 NOTE — Progress Notes (Signed)
Patient taught on peri care and plan of care explained. Cramps explained and educated.

## 2015-09-05 NOTE — Progress Notes (Signed)
UR chart review completed.  

## 2015-09-05 NOTE — Anesthesia Postprocedure Evaluation (Signed)
Anesthesia Post Note  Patient: Alyssa Randall  Procedure(s) Performed: * No procedures listed *  Patient location during evaluation: Mother Baby Anesthesia Type: Epidural Level of consciousness: awake and alert Pain management: pain level controlled Vital Signs Assessment: post-procedure vital signs reviewed and stable Respiratory status: spontaneous breathing, nonlabored ventilation and respiratory function stable Cardiovascular status: stable Postop Assessment: no headache, no backache and epidural receding Anesthetic complications: no     Last Vitals:  Vitals:   09/05/15 0330 09/05/15 0813  BP: 116/72 (!) 114/58  Pulse: 82 90  Resp: 18 18  Temp: 36.7 C 36.8 C    Last Pain:  Vitals:   09/05/15 1221  TempSrc:   PainSc: 4    Pain Goal:                 EchoStarMERRITT,Eldrick Penick

## 2015-09-06 MED ORDER — SENNOSIDES-DOCUSATE SODIUM 8.6-50 MG PO TABS: 2.0000 | ORAL_TABLET | Freq: Every evening | ORAL | 0 refills | Status: DC | PRN

## 2015-09-06 NOTE — Discharge Instructions (Signed)

## 2015-09-06 NOTE — Discharge Summary (Signed)
OB Discharge Summary     Patient Name: Alyssa Randall DOB: 1995/01/15 MRN: [id number]  Date of admission: 09/04/2015 Delivering MD: Lorne SkeensSCHENK, NICHOLAS MICHAEL   Date of discharge: 09/06/2015  Admitting diagnosis: INDUCTION Intrauterine pregnancy: 572w1d     Secondary diagnosis:  Active Problems:   Post-dates pregnancy      Discharge diagnosis: Term Pregnancy Delivered                                                                                                Post partum procedures:none  Augmentation: Pitocin, Cytotec and Foley Balloon  Complications: None  Hospital course:  Induction of Labor With Vaginal Delivery   20 y.o. yo G1P1001 at 3872w1d was admitted to the hospital 09/04/2015 for induction of labor.  Indication for induction: Postdates.  Patient had an uncomplicated labor course as follows: Membrane Rupture Time/Date: 12:10 PM ,09/04/2015   Intrapartum Procedures: Episiotomy: None [1]                                         Lacerations:  None [1]  Patient had delivery of a Viable infant.  Information for the patient's newborn:  Concepcion ElkWorthington, Boy Rian [[id number]][[id number]]  Delivery Method: Vaginal, Spontaneous Delivery (Filed from Delivery Summary)   09/05/2015  Details of delivery can be found in separate delivery note.  Patient had a routine postpartum course. Patient is discharged home 09/06/15.   Physical exam  Vitals:   09/05/15 0330 09/05/15 0813 09/05/15 1800 09/06/15 0548  BP: 116/72 (!) 114/58 113/76 129/84  Pulse: 82 90 89 76  Resp: 18 18 18 20   Temp: 98 F (36.7 C) 98.3 F (36.8 C) 97.5 F (36.4 C) 98.1 F (36.7 C)  TempSrc: Oral Oral Oral Oral  SpO2: 100% 98%    Weight:      Height:       General: alert and cooperative Lochia: appropriate Uterine Fundus: firm Incision: N/A DVT Evaluation: No evidence of DVT seen on physical exam. Labs: Lab Results  Component Value Date   WBC 13.6 (H) 09/04/2015   HGB 10.5 (L) 09/04/2015   HCT 32.0 (L)  09/04/2015   MCV 77.5 (L) 09/04/2015   PLT 298 09/04/2015   CMP Latest Ref Rng & Units 06/25/2015  Glucose 65 - 99 mg/dL 84  BUN 6 - 20 mg/dL 7  Creatinine 7.820.44 - 9.561.00 mg/dL 2.130.44  Sodium 086135 - 578145 mmol/L 134(L)  Potassium 3.5 - 5.1 mmol/L 3.9  Chloride 101 - 111 mmol/L 104  CO2 22 - 32 mmol/L 23  Calcium 8.9 - 10.3 mg/dL 8.9  Total Protein 6.5 - 8.1 g/dL 6.5  Total Bilirubin 0.3 - 1.2 mg/dL 4.6(N0.2(L)  Alkaline Phos 38 - 126 U/L 83  AST 15 - 41 U/L 20  ALT 14 - 54 U/L 18    Discharge instruction: per After Visit Summary and "Baby and Me Booklet".  After visit meds:    Medication List    TAKE these medications   ferrous sulfate 325 (65  FE) MG tablet Take 325 mg by mouth daily with breakfast.   prenatal multivitamin Tabs tablet Take 1 tablet by mouth daily at 12 noon.   promethazine 25 MG tablet Commonly known as:  PHENERGAN Take 1 tablet (25 mg total) by mouth every 6 (six) hours as needed for nausea or vomiting.   senna-docusate 8.6-50 MG tablet Commonly known as:  Senokot-S Take 2 tablets by mouth at bedtime as needed for mild constipation.       Diet: routine diet  Activity: Advance as tolerated. Pelvic rest for 6 weeks.   Outpatient follow up:6 weeks Follow up Appt:No future appointments. Follow up Visit:No Follow-up on file.  Postpartum contraception: Nexplanon  Newborn Data: Live born female  Birth Weight: 6 lb 10 oz (3005 g) APGAR: 8, 9  Baby Feeding: Breast Disposition:home with mother   09/06/2015 De Hollingshead, DO  OB FELLOW DISCHARGE ATTESTATION  I have seen and examined this patient and agree with above documentation in the resident's note.   Jen Mow, DO OB Fellow 11:25 PM

## 2015-09-06 NOTE — Lactation Note (Addendum)
This note was copied from a baby's chart. Lactation Consultation Note  Patient Name: Alyssa Randall Reason for consult: Follow-up assessment Asked by Peds to observe latch, baby noted to be chewing at breast with previous feeding along with short, anterior lingual frenulum. Mom latched this baby to left breast independently and baby appeared to obtain good depth. This LC does notice some intermittent chewing with suckling, more nutritive suckling observed with good breast compression, some noted swallows. On right breast, Mom reports some discomfort and having more difficulty with latch. LC assisted with latch on right breast and baby obtained good depth, Mom reported less discomfort. On right breast baby does demonstrate intermittent chewing with nursing but some good nutritive suckles as well, few swallows. Last void and stool at 0200 this am. Baby has had 4 voids in life - 3 in last 24 hours, 6 stools in life. Advised Mom if she goes home today to post pump after feedings and give any amount of EBM she receives back to baby. If Mom is not d/c today, then will have RN set up DEBP for Mom to post pump while at hospital to encourage milk production and to obtain EBM to supplement. Colostrum present with hand expression. Engorgement care reviewed if needed, advised of OP services and support group.  Mom to call for assist/questions or concerns.  Encouraged to continue to BF with feeding ques, 8-12 times or more in 24 hours, 15-20 minutes both breasts each feeding when possible. Encouraged Mom to schedule OP f/u next week due to short frenulum, Mom will call, will need to find transportation.   Maternal Data    Feeding Feeding Type: Breast Fed Length of feed: 10 min  LATCH Score/Interventions Latch: Grasps breast easily, tongue down, lips flanged, rhythmical sucking. Intervention(s): Adjust position;Assist with latch;Breast massage;Breast compression  Audible  Swallowing: A few with stimulation  Type of Nipple: Everted at rest and after stimulation  Comfort (Breast/Nipple): Soft / non-tender     Hold (Positioning): Assistance needed to correctly position infant at breast and maintain latch. Intervention(s): Breastfeeding basics reviewed;Support Pillows;Position options;Skin to skin  LATCH Score: 8  Lactation Tools Discussed/Used     Consult Status Consult Status: Follow-up Date: 09/07/15 Follow-up type: In-patient    [name], [name], 2:47 PM

## 2015-09-06 NOTE — Lactation Note (Signed)
This note was copied from a baby's chart. Lactation Consultation Note  Patient Name: Alyssa Alfredia ClientZakyia Griner BJYNW'GToday's Date: 09/06/2015 Reason for consult: Follow-up assessment  Anterior lingual frenulum noted when infant cries. Mom assisted w/putting infant to breast. Frequent swallows were verified by cervical auscultation & Mom denied pain. However, his jaw excursions were short and gave more of the appearance of a "chewing" motion. Dimpling also noted w/jaw excursions. MD made aware of finding. Will continue to observe how breastfeeding is going.   Mom's breasts are filling at 35 hours postpartum.   [name], [name] [facility] 09/06/2015, 12:18 PM

## 2015-09-07 ENCOUNTER — Ambulatory Visit: Payer: Self-pay

## 2015-09-07 NOTE — Lactation Note (Signed)
This note was copied from a baby's chart. Lactation Consultation Note  Patient Name: Alyssa Randall ClientZakyia Mower GNFAO'ZToday's Date: 09/07/2015 Reason for consult: Follow-up assessment  Baby is 1259 hour old, mom , baby , dad ready for D/C . Baby has been exclusively breast fed entire hospital stay.  LC praised mom for her efforts breast feeding. Voids and stools QS for age. At 45 Bili checks 9.7.  Per mom breast feeding is going well and baby is feeding well both breast. Mom denies soreness.  Sore nipple and engorgement prevention and tx. [facility] hand pump and she was aware  Of how to use it, also the #24 Flange was comfortable.  Mother informed of post-discharge support and given phone number to the lactation department, including services for phone call  assistance; out-patient appointments; and breastfeeding support group. List of other breastfeeding resources in the community given  in the handout. Encouraged mother to call for problems or concerns related to breastfeeding.   Maternal Data    Feeding Length of feed: 22 min  LATCH Score/Interventions                Intervention(s): Breastfeeding basics reviewed     Lactation Tools Discussed/Used WIC Program: Yes   Consult Status Consult Status: Complete Date: 09/07/15    Kathrin Greathouseorio, Clarabelle Oscarson Ann 09/07/2015, 11:43 AM

## 2015-10-17 ENCOUNTER — Ambulatory Visit (INDEPENDENT_AMBULATORY_CARE_PROVIDER_SITE_OTHER): Payer: Medicaid Other | Admitting: Obstetrics and Gynecology

## 2015-10-17 DIAGNOSIS — Z30017 Encounter for initial prescription of implantable subdermal contraceptive: Secondary | ICD-10-CM | POA: Diagnosis not present

## 2015-10-17 DIAGNOSIS — Z3202 Encounter for pregnancy test, result negative: Secondary | ICD-10-CM | POA: Diagnosis not present

## 2015-10-17 LAB — POCT URINE PREGNANCY: PREG TEST UR: NEGATIVE

## 2015-10-17 MED ORDER — ETONOGESTREL 68 MG ~~LOC~~ IMPL
68.0000 mg | DRUG_IMPLANT | Freq: Once | SUBCUTANEOUS | Status: AC
  Administered 2015-10-17: 68 mg via SUBCUTANEOUS

## 2015-10-17 NOTE — Progress Notes (Signed)
Procedure Note  Nexplanon Insertion.  UPT test negative. Implanon has been reviewed with pt. Pt has reviewed Implanon information and all questions have been answered.  Consent obtained  Left upper arm cleaned and anesthetized in the usual fashion. Implanon was inserted per manufactories direction. Pt tolerated well. Pt felt Implanon after insertion. Dressing applied Wound care reviewed

## 2015-10-17 NOTE — Addendum Note (Signed)
Addended by: Arne ClevelandHUTCHINSON, Karey Stucki J on: 10/17/2015 05:25 PM   Modules accepted: Orders

## 2015-10-17 NOTE — Progress Notes (Signed)
Post Partum Exam  Alyssa Randall is a 20 y.o. 121P1001 female who presents for a postpartum visit. She is 6 weeks postpartum following a spontaneous vaginal delivery. I have fully reviewed the prenatal and intrapartum course. The delivery was at 4435w1d gestational weeks.  Anesthesia: epidural. Postpartum course has been unremarkable. Baby's course has been unremarkable. Baby is feeding by breast. Bleeding no bleeding. Bowel function is normal. Bladder function is normal. Patient is not sexually active. Contraception method is Nexplanon. Postpartum depression screening:neg  The following portions of the patient's history were reviewed and updated as appropriate: allergies, current medications, past family history, past medical history, past social history and past surgical history.  Review of Systems Pertinent items are noted in HPI.   Objective:    BP 116/78 mmHg  Pulse 78  Resp 16  Ht 5\' 5"  (1.651 m)  Wt 211 lb (95.709 kg)  BMI 35.11 kg/m2  Breastfeeding? Yes  General:  alert   Breasts:  inspection negative, no nipple discharge or bleeding, no masses or nodularity palpable and deferred  Lungs: clear to auscultation bilaterally  Heart:  regular rate and rhythm, S1, S2 normal, no murmur, click, rub or gallop  Abdomen: soft, non-tender; bowel sounds normal; no masses,  no organomegaly   Vulva:  not evaluated  Vagina: not evaluated  Cervix:  not evaluated  Corpus: not examined  Adnexa:  not evaluated  Rectal Exam: Not performed.        Assessment:    Normal postpartum exam.  Plan:    1. Contraception: Nexplanon. Inserted today 2 Follow up in: 6 weeks or as needed.

## 2015-10-17 NOTE — Patient Instructions (Signed)
Etonogestrel implant What is this medicine? ETONOGESTREL (et oh noe JES trel) is a contraceptive (birth control) device. It is used to prevent pregnancy. It can be used for up to 3 years. This medicine may be used for other purposes; ask your health care provider or pharmacist if you have questions. What should I tell my health care provider before I take this medicine? They need to know if you have any of these conditions: -abnormal vaginal bleeding -blood vessel disease or blood clots -cancer of the breast, cervix, or liver -depression -diabetes -gallbladder disease -headaches -heart disease or recent heart attack -high blood pressure -high cholesterol -kidney disease -liver disease -renal disease -seizures -tobacco smoker -an unusual or allergic reaction to etonogestrel, other hormones, anesthetics or antiseptics, medicines, foods, dyes, or preservatives -pregnant or trying to get pregnant -breast-feeding How should I use this medicine? This device is inserted just under the skin on the inner side of your upper arm by a health care professional. Talk to your pediatrician regarding the use of this medicine in children. Special care may be needed. Overdosage: If you think you have taken too much of this medicine contact a poison control center or emergency room at once. NOTE: This medicine is only for you. Do not share this medicine with others. What if I miss a dose? This does not apply. What may interact with this medicine? Do not take this medicine with any of the following medications: -amprenavir -bosentan -fosamprenavir This medicine may also interact with the following medications: -barbiturate medicines for inducing sleep or treating seizures -certain medicines for fungal infections like ketoconazole and itraconazole -griseofulvin -medicines to treat seizures like carbamazepine, felbamate, oxcarbazepine, phenytoin,  topiramate -modafinil -phenylbutazone -rifampin -some medicines to treat HIV infection like atazanavir, indinavir, lopinavir, nelfinavir, tipranavir, ritonavir -St. John's wort This list may not describe all possible interactions. Give your health care provider a list of all the medicines, herbs, non-prescription drugs, or dietary supplements you use. Also tell them if you smoke, drink alcohol, or use illegal drugs. Some items may interact with your medicine. What should I watch for while using this medicine? This product does not protect you against HIV infection (AIDS) or other sexually transmitted diseases. You should be able to feel the implant by pressing your fingertips over the skin where it was inserted. Contact your doctor if you cannot feel the implant, and use a non-hormonal birth control method (such as condoms) until your doctor confirms that the implant is in place. If you feel that the implant may have broken or become bent while in your arm, contact your healthcare provider. What side effects may I notice from receiving this medicine? Side effects that you should report to your doctor or health care professional as soon as possible: -allergic reactions like skin rash, itching or hives, swelling of the face, lips, or tongue -breast lumps -changes in emotions or moods -depressed mood -heavy or prolonged menstrual bleeding -pain, irritation, swelling, or bruising at the insertion site -scar at site of insertion -signs of infection at the insertion site such as fever, and skin redness, pain or discharge -signs of pregnancy -signs and symptoms of a blood clot such as breathing problems; changes in vision; chest pain; severe, sudden headache; pain, swelling, warmth in the leg; trouble speaking; sudden numbness or weakness of the face, arm or leg -signs and symptoms of liver injury like dark yellow or brown urine; general ill feeling or flu-like symptoms; light-colored stools; loss of  appetite; nausea; right upper belly   pain; unusually weak or tired; yellowing of the eyes or skin -unusual vaginal bleeding, discharge -signs and symptoms of a stroke like changes in vision; confusion; trouble speaking or understanding; severe headaches; sudden numbness or weakness of the face, arm or leg; trouble walking; dizziness; loss of balance or coordination Side effects that usually do not require medical attention (Report these to your doctor or health care professional if they continue or are bothersome.): -acne -back pain -breast pain -changes in weight -dizziness -general ill feeling or flu-like symptoms -headache -irregular menstrual bleeding -nausea -sore throat -vaginal irritation or inflammation This list may not describe all possible side effects. Call your doctor for medical advice about side effects. You may report side effects to FDA at 1-800-FDA-1088. Where should I keep my medicine? This drug is given in a hospital or clinic and will not be stored at home. NOTE: This sheet is a summary. It may not cover all possible information. If you have questions about this medicine, talk to your doctor, pharmacist, or health care provider.    2016, Elsevier/Gold Standard. (2013-10-07 14:07:06)  

## 2015-10-18 ENCOUNTER — Encounter: Payer: Self-pay | Admitting: Obstetrics and Gynecology

## 2015-11-28 ENCOUNTER — Ambulatory Visit: Payer: Medicaid Other | Admitting: Obstetrics and Gynecology

## 2016-03-20 ENCOUNTER — Encounter (HOSPITAL_COMMUNITY): Payer: Self-pay | Admitting: Emergency Medicine

## 2016-03-20 ENCOUNTER — Emergency Department (HOSPITAL_COMMUNITY)
Admission: EM | Admit: 2016-03-20 | Discharge: 2016-03-20 | Disposition: A | Discharge status: Home / Self Care | Payer: Self-pay | Attending: Dermatology | Admitting: Dermatology

## 2016-03-20 DIAGNOSIS — M79671 Pain in right foot: Secondary | ICD-10-CM | POA: Insufficient documentation

## 2016-03-20 DIAGNOSIS — Z5321 Procedure and treatment not carried out due to patient leaving prior to being seen by health care provider: Secondary | ICD-10-CM | POA: Insufficient documentation

## 2016-03-20 NOTE — ED Triage Notes (Addendum)
Patient c/o pain to second toe on the right foot x2 weeks. Denies injury. Ambulatory to triage.

## 2016-03-20 NOTE — ED Notes (Signed)
Pt called once, no answer

## 2016-03-21 ENCOUNTER — Emergency Department (HOSPITAL_COMMUNITY)
Admission: EM | Admit: 2016-03-21 | Discharge: 2016-03-21 | Disposition: A | Discharge status: Home / Self Care | Payer: Self-pay | Attending: Emergency Medicine | Admitting: Emergency Medicine

## 2016-03-21 ENCOUNTER — Encounter (HOSPITAL_COMMUNITY): Payer: Self-pay

## 2016-03-21 DIAGNOSIS — L03031 Cellulitis of right toe: Secondary | ICD-10-CM | POA: Insufficient documentation

## 2016-03-21 NOTE — ED Notes (Signed)
Pt c/o 8/10 right foot second toe pain. Denies injury. States white drainage coming from toe. Pt able to ambulate with steady gait. See providers assessment.

## 2016-03-21 NOTE — ED Triage Notes (Signed)
Pt complaining of pain and swelling to R second toe. Pt denies any injury/trauma. Pt ambulatory. Pt denies any fevers/chills.

## 2016-03-21 NOTE — ED Provider Notes (Signed)
[facility]-EMERGENCY DEPT Provider Note   CSN: [id number] Arrival date & time: 03/21/16  1740   By signing my name below, I, [name], attest that this documentation has been prepared under the direction and in the presence of Bethel BornKelly Marie Caylor Cerino, PA-C Electronically Signed: [name], ED Scribe. 03/21/16. 6:41 PM.  History   Chief Complaint Chief Complaint  Patient presents with  . Toe Pain    HPI Alyssa Randall is a 21 y.o. female who presents to the Emergency Department complaining of right second toe pain onset 4 days ago. Pt reports associated swelling to right second toe and yellow drainage from right second toe. Pt has tried warm soaks and draining the affected area with mild relief of her symptoms. Denies similar symptoms in the past. She denies fever, chills, and any other symptoms. Denies having a PCP.   The history is provided by the patient. No language interpreter was used.    Past Medical History:  Diagnosis Date  . Infection    UTI    There are no active problems to display for this patient.   Past Surgical History:  Procedure Laterality Date  . NO PAST SURGERIES    . TOOTH EXTRACTION      OB History    Gravida Para Term Preterm AB Living   1 1 1     1    SAB TAB Ectopic Multiple Live Births         0 1       Home Medications    Prior to Admission medications   Medication Sig Start Date End Date Taking? Authorizing Provider  Prenatal Vit-Fe Fumarate-FA (PRENATAL MULTIVITAMIN) TABS tablet Take 1 tablet by mouth daily at 12 noon.    Historical Provider, MD    Family History Family History  Problem Relation Age of Onset  . Diabetes Father     Social History Social History  Substance Use Topics  . Smoking status: Never Smoker  . Smokeless tobacco: Never Used  . Alcohol use No     Allergies   Patient has no known allergies.   Review of Systems Review of Systems  Constitutional: Negative for chills and fever.  Musculoskeletal:  Positive for arthralgias (right second toe) and joint swelling (right second toe).  Skin:       +Yellow drainage from right second toe     Physical Exam Updated Vital Signs BP 112/67 (BP Location: Left Arm)   Pulse 92   Temp 98.7 F (37.1 C) (Oral)   Resp 15   LMP 02/20/2016 (Approximate)   SpO2 99%   Physical Exam  Constitutional: She is oriented to person, place, and time. She appears well-developed and well-nourished. No distress.  HENT:  Head: Normocephalic and atraumatic.  Eyes: EOM are normal.  Neck: Neck supple.  Cardiovascular: Normal rate.   Pulmonary/Chest: Effort normal. No respiratory distress.  Abdominal: She exhibits no distension.  Musculoskeletal: Normal range of motion.  Paronychia to lateral nail fold of right second toe. Area is TTP without drainage.   Neurological: She is alert and oriented to person, place, and time.  Skin: Skin is warm and dry.  Psychiatric: She has a normal mood and affect. Her behavior is normal.  Nursing note and vitals reviewed.    ED Treatments / Results  DIAGNOSTIC STUDIES: Oxygen Saturation is 99% on RA, nl by my interpretation.    COORDINATION OF CARE: 6:27 PM Discussed treatment plan with pt at bedside which includes I&D and pt agreed to  plan.   Procedures .Marland KitchenIncision and Drainage Date/Time: 03/21/2016 6:47 PM Performed by: Terance Hart MARIE Authorized by: Terance Hart MARIE   Consent:    Consent obtained:  Verbal   Consent given by:  Patient   Risks discussed:  Incomplete drainage and infection Location:    Indications for incision and drainage: paronychia.   Location:  Lower extremity   Lower extremity location:  Toe   Toe location:  R second toe Pre-procedure details:    Procedure prep: alcohol. Anesthesia (see MAR for exact dosages):    Anesthesia method:  Local infiltration   Local anesthetic:  Lidocaine 2% w/o epi (2 cc used) Procedure type:    Complexity:  Simple Procedure details:    Needle  aspiration: no     Incision types:  Stab incision   Incision depth:  Dermal   Scalpel blade:  11   Wound management:  Irrigated with saline   Drainage:  Bloody   Drainage amount:  Scant   Packing materials:  None Post-procedure details:    Patient tolerance of procedure:  Tolerated well, no immediate complications   (including critical care time)  Medications Ordered in ED Medications - No data to display   Initial Impression / Assessment and Plan / ED Course  I have reviewed the triage vital signs and the nursing notes.  21 year old female with paronychia of toe. I&D performed and minimal drainage was expressed. Wound care discussed. Advised follow up with podiatry if symptoms persist. Return precautions given.  Final Clinical Impressions(s) / ED Diagnoses   Final diagnoses:  Paronychia of second toe of right foot    New Prescriptions Discharge Medication List as of 03/21/2016  6:58 PM     I personally performed the services described in this documentation, which was scribed in my presence. The recorded information has been reviewed and is accurate.     [name], PA-C 03/23/16 1148    Marily Memos, MD 03/24/16 [phone]

## 2016-03-21 NOTE — ED Notes (Signed)
Pt stable, understands discharge instructions, and reasons for return.   

## 2016-03-21 NOTE — Discharge Instructions (Signed)
Continue warm soaks Clean with soap and water Follow up with foot doctor if you symptoms don't improve over the next week

## 2016-03-21 NOTE — ED Notes (Signed)
I&D tray at bedside. PA made aware.

## 2016-04-01 ENCOUNTER — Encounter (HOSPITAL_COMMUNITY): Payer: Self-pay

## 2016-04-01 ENCOUNTER — Emergency Department (HOSPITAL_COMMUNITY)
Admission: EM | Admit: 2016-04-01 | Discharge: 2016-04-01 | Disposition: A | Discharge status: Home / Self Care | Payer: Self-pay | Attending: Emergency Medicine | Admitting: Emergency Medicine

## 2016-04-01 DIAGNOSIS — Z79899 Other long term (current) drug therapy: Secondary | ICD-10-CM | POA: Insufficient documentation

## 2016-04-01 DIAGNOSIS — L089 Local infection of the skin and subcutaneous tissue, unspecified: Secondary | ICD-10-CM | POA: Insufficient documentation

## 2016-04-01 MED ORDER — IBUPROFEN 600 MG PO TABS
600.0000 mg | ORAL_TABLET | Freq: Four times a day (QID) | ORAL | 0 refills | Status: DC | PRN
Start: 2016-04-01 — End: 2016-08-11

## 2016-04-01 MED ORDER — CEPHALEXIN 500 MG PO CAPS: 500.0000 mg | ORAL_CAPSULE | Freq: Four times a day (QID) | ORAL | 0 refills | Status: DC

## 2016-04-01 NOTE — ED Provider Notes (Signed)
[facility]-EMERGENCY DEPT Provider Note    By signing my name below, I, Earmon Phoenix, attest that this documentation has been prepared under the direction and in the presence of Elpidio Anis, PA-C. Electronically Signed: Earmon Phoenix, ED Scribe. 04/01/16. 9:05 PM.    History   Chief Complaint Chief Complaint  Patient presents with  . Toe Pain    right 2nd toe   The history is provided by the patient and medical records. No language interpreter was used.    Alyssa Randall is a 21 y.o. female who presents to the Emergency Department complaining of right second toe pain that began approximately two weeks ago. She reports associated bleeding, purulent drainage and pain. She states she was seen at [facility] and had an ingrown toe nail removed from the toe. She has been soaking the toe. She has not taken anything for pain relief. Touching the area increases the pain. She denies alleviating factors. She denies fever, chills, nausea, vomiting.    Past Medical History:  Diagnosis Date  . Infection    UTI    There are no active problems to display for this patient.   Past Surgical History:  Procedure Laterality Date  . NO PAST SURGERIES    . TOOTH EXTRACTION      OB History    Gravida Para Term Preterm AB Living   1 1 1     1    SAB TAB Ectopic Multiple Live Births         0 1       Home Medications    Prior to Admission medications   Medication Sig Start Date End Date Taking? Authorizing Provider  cephALEXin (KEFLEX) 500 MG capsule Take 1 capsule (500 mg total) by mouth 4 (four) times daily. 04/01/16   Elpidio Anis, PA-C  ibuprofen (ADVIL,MOTRIN) 600 MG tablet Take 1 tablet (600 mg total) by mouth every 6 (six) hours as needed. 04/01/16   Elpidio Anis, PA-C  Prenatal Vit-Fe Fumarate-FA (PRENATAL MULTIVITAMIN) TABS tablet Take 1 tablet by mouth daily at 12 noon.    Historical Provider, MD    Family History Family History  Problem Relation Age of Onset  . Diabetes  Father     Social History Social History  Substance Use Topics  . Smoking status: Never Smoker  . Smokeless tobacco: Never Used  . Alcohol use No     Allergies   Patient has no known allergies.   Review of Systems Review of Systems  Constitutional: Negative for chills and fever.  Gastrointestinal: Negative for nausea and vomiting.  Skin: Positive for wound.     Physical Exam Updated Vital Signs BP 130/82 (BP Location: Left Arm)   Pulse 79   Temp 97.6 F (36.4 C) (Oral)   Resp 16   Ht 5\' 1"  (1.549 m)   Wt 125 lb (56.7 kg)   LMP 03/31/2016   SpO2 100%   BMI 23.62 kg/m   Physical Exam  Constitutional: She is oriented to person, place, and time. She appears well-developed and well-nourished.  HENT:  Head: Normocephalic and atraumatic.  Neck: Normal range of motion.  Cardiovascular: Normal rate.   Pulmonary/Chest: Effort normal.  Musculoskeletal: Normal range of motion. She exhibits tenderness.  Right second toe has no significant swelling or redness. Bloody, purulent drainage from lateral nail without apparent fluid collection under cuticle. No subungual hematoma. Consistent with ingrown toe nail.  Neurological: She is alert and oriented to person, place, and time.  Skin: Skin is warm and  dry.  Psychiatric: She has a normal mood and affect. Her behavior is normal.  Nursing note and vitals reviewed.    ED Treatments / Results  DIAGNOSTIC STUDIES: Oxygen Saturation is 100% on RA, normal by my interpretation.   COORDINATION OF CARE: 8:50 PM- Will prescribe antibiotic and refer to podiatrist. Pt verbalizes understanding and agrees to plan.  Medications - No data to display  Labs (all labs ordered are listed, but only abnormal results are displayed) Labs Reviewed - No data to display  EKG  EKG Interpretation None       Radiology No results found.  Procedures Procedures (including critical care time)  Medications Ordered in ED Medications - No  data to display   Initial Impression / Assessment and Plan / ED Course  I have reviewed the triage vital signs and the nursing notes.  Pertinent labs & imaging results that were available during my care of the patient were reviewed by me and considered in my medical decision making (see chart for details).     Patient with ss/sxs ingrown toenail. She has already had ED partial nail removal. Will start on Keflex and refer to podiatry.   I personally performed the services described in this documentation, which was scribed in my presence. The recorded information has been reviewed and is accurate.   Final Clinical Impressions(s) / ED Diagnoses   Final diagnoses:  Toe infection    New Prescriptions New Prescriptions   CEPHALEXIN (KEFLEX) 500 MG CAPSULE    Take 1 capsule (500 mg total) by mouth 4 (four) times daily.   IBUPROFEN (ADVIL,MOTRIN) 600 MG TABLET    Take 1 tablet (600 mg total) by mouth every 6 (six) hours as needed.     Elpidio AnisShari Brigham Cobbins, PA-C 04/03/16 0113    Arby BarretteMarcy Pfeiffer, MD 04/06/16 1538

## 2016-04-01 NOTE — ED Triage Notes (Signed)
Patient states she had her right second toe drained approx 2 weeks ago. Patient c/o that the redness, swelling and pain are worse.

## 2016-08-10 ENCOUNTER — Encounter (HOSPITAL_COMMUNITY): Payer: Self-pay | Admitting: Emergency Medicine

## 2016-08-10 DIAGNOSIS — J019 Acute sinusitis, unspecified: Secondary | ICD-10-CM | POA: Insufficient documentation

## 2016-08-10 DIAGNOSIS — Z79899 Other long term (current) drug therapy: Secondary | ICD-10-CM | POA: Insufficient documentation

## 2016-08-10 LAB — CBC WITH DIFFERENTIAL/PLATELET
BASOS PCT: 0 %
Basophils Absolute: 0 10*3/uL (ref 0.0–0.1)
Eosinophils Absolute: 0.8 10*3/uL — ABNORMAL HIGH (ref 0.0–0.7)
Eosinophils Relative: 5 %
HCT: 40 % (ref 36.0–46.0)
Hemoglobin: 13.1 g/dL (ref 12.0–15.0)
LYMPHS PCT: 8 %
Lymphs Abs: 1.2 10*3/uL (ref 0.7–4.0)
MCH: 28 pg (ref 26.0–34.0)
MCHC: 32.8 g/dL (ref 30.0–36.0)
MCV: 85.5 fL (ref 78.0–100.0)
MONO ABS: 0.7 10*3/uL (ref 0.1–1.0)
Monocytes Relative: 5 %
NEUTROS ABS: 13 10*3/uL — AB (ref 1.7–7.7)
NEUTROS PCT: 82 %
PLATELETS: 354 10*3/uL (ref 150–400)
RBC: 4.68 MIL/uL (ref 3.87–5.11)
RDW: 13.2 % (ref 11.5–15.5)
WBC: 15.7 10*3/uL — ABNORMAL HIGH (ref 4.0–10.5)

## 2016-08-10 LAB — COMPREHENSIVE METABOLIC PANEL
ALBUMIN: 4.1 g/dL (ref 3.5–5.0)
ALT: 11 U/L — ABNORMAL LOW (ref 14–54)
ANION GAP: 7 (ref 5–15)
AST: 16 U/L (ref 15–41)
Alkaline Phosphatase: 63 U/L (ref 38–126)
BILIRUBIN TOTAL: 0.7 mg/dL (ref 0.3–1.2)
BUN: 6 mg/dL (ref 6–20)
CHLORIDE: 101 mmol/L (ref 101–111)
CO2: 26 mmol/L (ref 22–32)
Calcium: 9.2 mg/dL (ref 8.9–10.3)
Creatinine, Ser: 0.84 mg/dL (ref 0.44–1.00)
GFR calc Af Amer: >60 mL/min (ref >60)
GFR calc non Af Amer: >60 mL/min (ref >60)
GLUCOSE: 127 mg/dL — AB (ref 65–99)
POTASSIUM: 3.4 mmol/L — AB (ref 3.5–5.1)
SODIUM: 134 mmol/L — AB (ref 135–145)
TOTAL PROTEIN: 7.2 g/dL (ref 6.5–8.1)

## 2016-08-10 LAB — URINALYSIS, ROUTINE W REFLEX MICROSCOPIC
Glucose, UA: NEGATIVE mg/dL
Ketones, ur: 15 mg/dL — AB
Nitrite: NEGATIVE
PROTEIN: NEGATIVE mg/dL
Specific Gravity, Urine: >1.03 — ABNORMAL HIGH (ref 1.005–1.030)
pH: 6 (ref 5.0–8.0)

## 2016-08-10 LAB — URINALYSIS, MICROSCOPIC (REFLEX)

## 2016-08-10 LAB — RAPID STREP SCREEN (MED CTR MEBANE ONLY): STREPTOCOCCUS, GROUP A SCREEN (DIRECT): NEGATIVE

## 2016-08-10 LAB — I-STAT CG4 LACTIC ACID, ED: Lactic Acid, Venous: 0.89 mmol/L (ref 0.5–1.9)

## 2016-08-10 NOTE — ED Triage Notes (Signed)
Pt c./o 7/10 sore throat and HA that started this morning with chills, no nausea or vomiting.

## 2016-08-11 ENCOUNTER — Emergency Department (HOSPITAL_COMMUNITY)
Admission: EM | Admit: 2016-08-11 | Discharge: 2016-08-11 | Disposition: A | Discharge status: Home / Self Care | Payer: Self-pay | Attending: Emergency Medicine | Admitting: Emergency Medicine

## 2016-08-11 ENCOUNTER — Emergency Department (HOSPITAL_COMMUNITY): Payer: Self-pay

## 2016-08-11 DIAGNOSIS — J019 Acute sinusitis, unspecified: Secondary | ICD-10-CM

## 2016-08-11 DIAGNOSIS — J029 Acute pharyngitis, unspecified: Secondary | ICD-10-CM

## 2016-08-11 MED ORDER — IBUPROFEN 600 MG PO TABS: 600.0000 mg | ORAL_TABLET | Freq: Four times a day (QID) | ORAL | 0 refills | Status: DC | PRN

## 2016-08-11 MED ORDER — DEXAMETHASONE SODIUM PHOSPHATE 10 MG/ML IJ SOLN
10.0000 mg | Freq: Once | INTRAMUSCULAR | Status: AC
  Administered 2016-08-11: 10 mg via INTRAVENOUS
  Filled 2016-08-11: qty 1

## 2016-08-11 MED ORDER — ALBUTEROL SULFATE HFA 108 (90 BASE) MCG/ACT IN AERS: 2.0000 | INHALATION_SPRAY | Freq: Four times a day (QID) | RESPIRATORY_TRACT | Status: DC | PRN

## 2016-08-11 MED ORDER — KETOROLAC TROMETHAMINE 30 MG/ML IJ SOLN
30.0000 mg | Freq: Once | INTRAMUSCULAR | Status: AC
  Administered 2016-08-11: 30 mg via INTRAVENOUS
  Filled 2016-08-11: qty 1

## 2016-08-11 MED ORDER — ACETAMINOPHEN 500 MG PO TABS
1000.0000 mg | ORAL_TABLET | Freq: Once | ORAL | Status: AC
  Administered 2016-08-11: 1000 mg via ORAL
  Filled 2016-08-11: qty 2

## 2016-08-11 MED ORDER — BENZONATATE 100 MG PO CAPS: 100.0000 mg | ORAL_CAPSULE | Freq: Three times a day (TID) | ORAL | 0 refills | Status: DC

## 2016-08-11 MED ORDER — SODIUM CHLORIDE 0.9 % IV BOLUS (SEPSIS)
1000.0000 mL | Freq: Once | INTRAVENOUS | Status: AC
  Administered 2016-08-11: 1000 mL via INTRAVENOUS

## 2016-08-11 MED ORDER — IBUPROFEN 800 MG PO TABS
800.0000 mg | ORAL_TABLET | Freq: Once | ORAL | Status: AC
  Administered 2016-08-11: 800 mg via ORAL
  Filled 2016-08-11: qty 1

## 2016-08-11 MED ORDER — IBUPROFEN 800 MG PO TABS: 800.0000 mg | ORAL_TABLET | Freq: Once | ORAL | Status: DC

## 2016-08-11 MED ORDER — HYDROCODONE-ACETAMINOPHEN 7.5-325 MG/15ML PO SOLN: 10.0000 mL | Freq: Three times a day (TID) | ORAL | 0 refills | Status: DC | PRN

## 2016-08-11 MED ORDER — BENZONATATE 100 MG PO CAPS
200.0000 mg | ORAL_CAPSULE | Freq: Once | ORAL | Status: AC
  Administered 2016-08-11: 200 mg via ORAL
  Filled 2016-08-11: qty 2

## 2016-08-11 NOTE — ED Notes (Signed)
Patient transported to X-ray 

## 2016-08-11 NOTE — Discharge Instructions (Signed)
We recommend ibuprofen for body aches and pain. You may take Tessalon for cough and use Hycet for sore throat. Use an albuterol inhaler for cough or shortness of breath. Get plenty of rest and drink plenty of fluids to prevent dehydration. Follow-up with your primary care doctor to ensure resolution of symptoms.

## 2016-08-11 NOTE — ED Provider Notes (Signed)
[facility]-EMERGENCY DEPT Provider Note   CSN: [id number] Arrival date & time: 08/10/16  2226    History   Chief Complaint Chief Complaint  Patient presents with  . Sore Throat  . Headache    HPI Alyssa Randall is a 21 y.o. female.  21 year old female with no significant past medical history presents to the emergency department for evaluation of sore throat. She states that symptoms have been constant and began this morning; worsening over time. She has had associated chills as well as a fever of maximum 101.60F prior to arrival. She reports taking some "sore throat and fever medicines" prior to arrival without relief. She has had a cough as well as a headache, body aches, and nasal congestion. She denies any sick contacts or shortness of breath. No nausea, vomiting, diarrhea.   The history is provided by the patient. No language interpreter was used.  Sore Throat  Associated symptoms include headaches.  Headache   Associated symptoms include a fever (101.60F).    Past Medical History:  Diagnosis Date  . Infection    UTI    There are no active problems to display for this patient.   Past Surgical History:  Procedure Laterality Date  . NO PAST SURGERIES    . TOOTH EXTRACTION      OB History    Gravida Para Term Preterm AB Living   1 1 1     1    SAB TAB Ectopic Multiple Live Births         0 1       Home Medications    Prior to Admission medications   Medication Sig Start Date End Date Taking? Authorizing Provider  benzonatate (TESSALON) 100 MG capsule Take 1 capsule (100 mg total) by mouth every 8 (eight) hours. 08/11/16   Antony Madura, PA-C  cephALEXin (KEFLEX) 500 MG capsule Take 1 capsule (500 mg total) by mouth 4 (four) times daily. 04/01/16   Elpidio Anis, PA-C  HYDROcodone-acetaminophen (HYCET) 7.5-325 mg/15 ml solution Take 10 mLs by mouth every 8 (eight) hours as needed (severe sore throat). 08/11/16   Antony Madura, PA-C  ibuprofen (ADVIL,MOTRIN) 600 MG  tablet Take 1 tablet (600 mg total) by mouth every 6 (six) hours as needed. 08/11/16   Antony Madura, PA-C  Prenatal Vit-Fe Fumarate-FA (PRENATAL MULTIVITAMIN) TABS tablet Take 1 tablet by mouth daily at 12 noon.    [provider]    Family History Family History  Problem Relation Age of Onset  . Diabetes Father     Social History Social History  Substance Use Topics  . Smoking status: Never Smoker  . Smokeless tobacco: Never Used  . Alcohol use No     Allergies   Patient has no known allergies.   Review of Systems Review of Systems  Constitutional: Positive for chills and fever (101.60F).  HENT: Positive for sore throat.   Neurological: Positive for headaches.  Ten systems reviewed and are negative for acute change, except as noted in the HPI.    Physical Exam Updated Vital Signs BP 98/80   Pulse 95   Temp 99.6 F (37.6 C) (Oral)   Resp 16   Ht 5' (1.524 m)   Wt 56.7 kg (125 lb)   SpO2 93%   BMI 24.41 kg/m   Physical Exam  Constitutional: She is oriented to person, place, and time. She appears well-developed and well-nourished. No distress.  HENT:  Head: Normocephalic and atraumatic.  Uvula midline. Oropharynx clear. No exudates or  erythema. No stridor or tripoding. Patient tolerating secretions without difficulty.  Eyes: Conjunctivae and EOM are normal. No scleral icterus.  Neck: Normal range of motion.  No meningismus  Cardiovascular: Regular rhythm and intact distal pulses.   Tachycardia  Pulmonary/Chest: Effort normal. No respiratory distress.  Congested sounding cough noted at bedside. Lungs CTAB.  Musculoskeletal: Normal range of motion.  Neurological: She is alert and oriented to person, place, and time. She exhibits normal muscle tone. Coordination normal.  GCS 15. Patient moving all extremities.  Skin: Skin is warm and dry. No rash noted. She is not diaphoretic. No erythema. No pallor.  Psychiatric: She has a normal mood and affect. Her  behavior is normal.  Nursing note and vitals reviewed.    ED Treatments / Results  Labs (all labs ordered are listed, but only abnormal results are displayed) Labs Reviewed  COMPREHENSIVE METABOLIC PANEL - Abnormal; Notable for the following:       Result Value   Sodium 134 (*)    Potassium 3.4 (*)    Glucose, Bld 127 (*)    ALT 11 (*)    All other components within normal limits  CBC WITH DIFFERENTIAL/PLATELET - Abnormal; Notable for the following:    WBC 15.7 (*)    Neutro Abs 13.0 (*)    Eosinophils Absolute 0.8 (*)    All other components within normal limits  URINALYSIS, ROUTINE W REFLEX MICROSCOPIC - Abnormal; Notable for the following:    APPearance CLOUDY (*)    Specific Gravity, Urine >1.030 (*)    Hgb urine dipstick TRACE (*)    Bilirubin Urine SMALL (*)    Ketones, ur 15 (*)    Leukocytes, UA SMALL (*)    All other components within normal limits  URINALYSIS, MICROSCOPIC (REFLEX) - Abnormal; Notable for the following:    Bacteria, UA RARE (*)    Squamous Epithelial / LPF 6-30 (*)    All other components within normal limits  RAPID STREP SCREEN (NOT AT [facility])  CULTURE, BLOOD (ROUTINE X 2)  CULTURE, BLOOD (ROUTINE X 2)  CULTURE, GROUP A STREP ([facility])  I-STAT CG4 LACTIC ACID, ED    EKG  EKG Interpretation None       Radiology Dg Chest 2 View  Result Date: 08/11/2016 CLINICAL DATA:  Acute onset of cough and congestion. Shortness of breath, fever and generalized chest pain. Initial encounter. EXAM: CHEST  2 VIEW COMPARISON:  None. FINDINGS: The lungs are well-aerated and clear. There is no evidence of focal opacification, pleural effusion or pneumothorax. The heart is normal in size; the mediastinal contour is within normal limits. No acute osseous abnormalities are seen. IMPRESSION: No acute cardiopulmonary process seen. Electronically Signed   By: Roanna Raider M.D.   On: 08/11/2016 01:28    Procedures Procedures (including critical care  time)  Medications Ordered in ED Medications  albuterol (PROVENTIL HFA;VENTOLIN HFA) 108 (90 Base) MCG/ACT inhaler 2 puff (not administered)  sodium chloride 0.9 % bolus 1,000 mL (0 mLs Intravenous Stopped 08/11/16 0344)  dexamethasone (DECADRON) injection 10 mg (10 mg Intravenous Given 08/11/16 0204)  acetaminophen (TYLENOL) tablet 1,000 mg (1,000 mg Oral Given 08/11/16 0204)  ketorolac (TORADOL) 30 MG/ML injection 30 mg (30 mg Intravenous Given 08/11/16 0204)  benzonatate (TESSALON) capsule 200 mg (200 mg Oral Given 08/11/16 0204)  sodium chloride 0.9 % bolus 1,000 mL (1,000 mLs Intravenous New Bag/Given 08/11/16 0329)  ibuprofen (ADVIL,MOTRIN) tablet 800 mg (800 mg Oral Given 08/11/16 0329)  Initial Impression / Assessment and Plan / ED Course  I have reviewed the triage vital signs and the nursing notes.  Pertinent labs & imaging results that were available during my care of the patient were reviewed by me and considered in my medical decision making (see chart for details).     Patient complaining of symptoms of acute sinusitis. Mild to moderate symptoms of clear/yellow nasal discharge/congestion and scratchy throat with cough for less than 10 days. Patient is afebrile in the ED, though does report a fever at home. No concern for acute bacterial rhinosinusitis; likely viral in nature. Patient discharged with symptomatic treatment. She feels improved after supportive management in the ED. Recommendations given for follow-up with primary care physician.  Patient discharged in stable condition with no unaddressed concerns.   Final Clinical Impressions(s) / ED Diagnoses   Final diagnoses:  Acute non-recurrent sinusitis, unspecified location  Pharyngitis, unspecified etiology    New Prescriptions New Prescriptions   BENZONATATE (TESSALON) 100 MG CAPSULE    Take 1 capsule (100 mg total) by mouth every 8 (eight) hours.   HYDROCODONE-ACETAMINOPHEN (HYCET) 7.5-325 MG/15 ML SOLUTION    Take 10  mLs by mouth every 8 (eight) hours as needed (severe sore throat).   IBUPROFEN (ADVIL,MOTRIN) 600 MG TABLET    Take 1 tablet (600 mg total) by mouth every 6 (six) hours as needed.     Antony MaduraHumes, Shadi Sessler, PA-C 08/11/16 0401    Zadie RhineWickline, Donald, MD 08/11/16 [phone]

## 2016-08-13 LAB — CULTURE, GROUP A STREP (THRC)

## 2016-08-15 LAB — CULTURE, BLOOD (ROUTINE X 2)
CULTURE: NO GROWTH
Culture: NO GROWTH
SPECIAL REQUESTS: ADEQUATE
SPECIAL REQUESTS: ADEQUATE

## 2017-06-04 IMAGING — US US MFM OB LIMITED
1 series · 15 of 28 positions shown · non-contrast
Comparison: none

[Series 1: us mfm ob limited · 31 acquisitions, 15 frames shown]
[im 1/31]
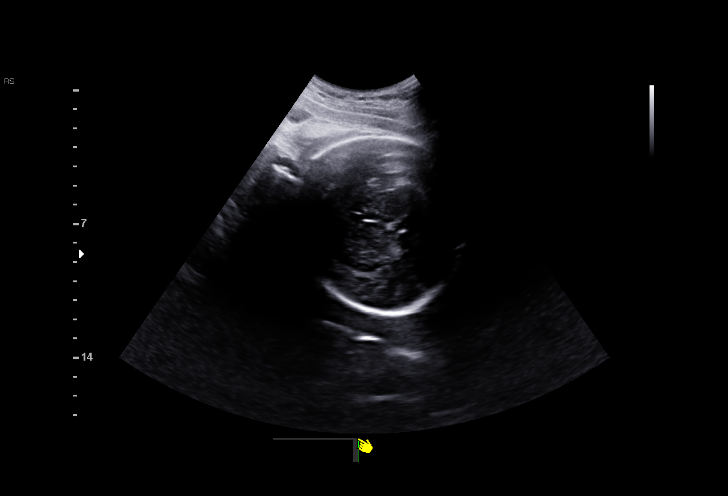
[im 3/31]
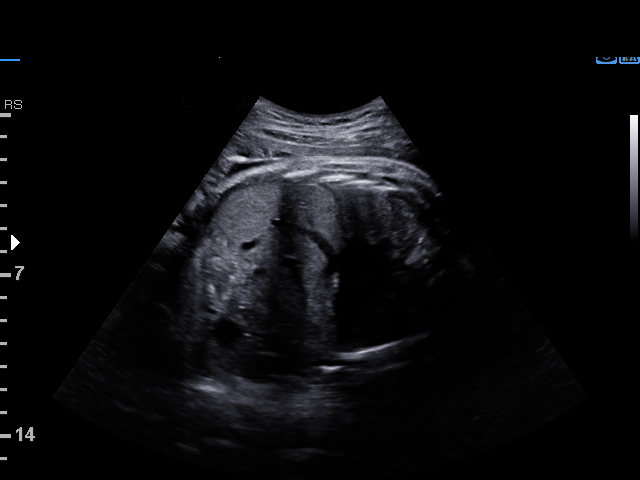
[im 5/31]
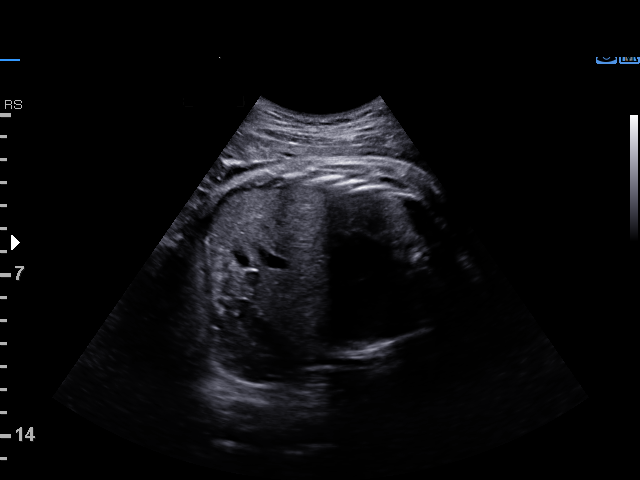
[im 7/31]
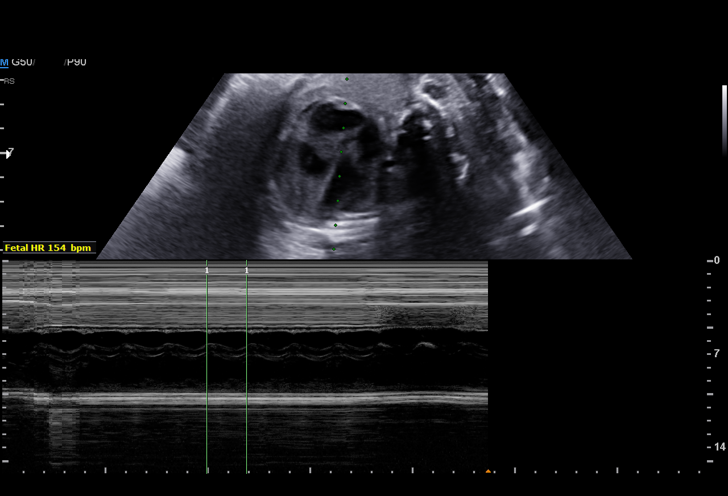
[im 9/31]
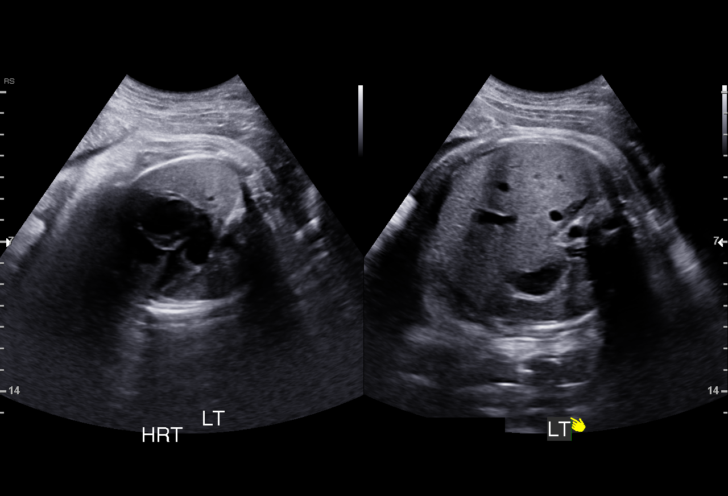
[im 12/31]
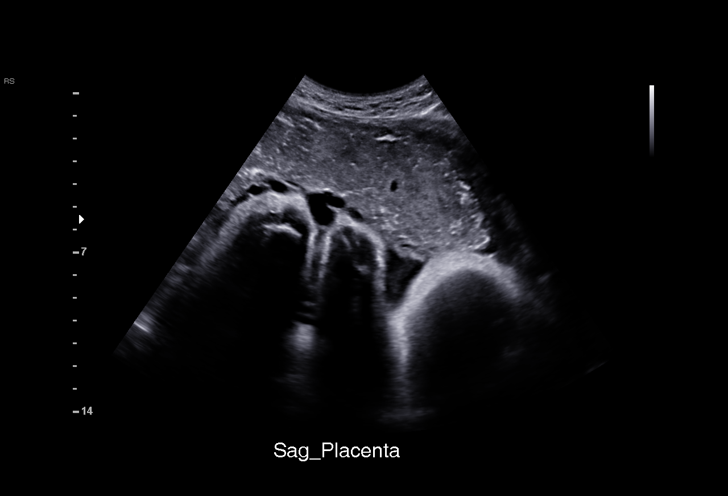
[im 14/31]
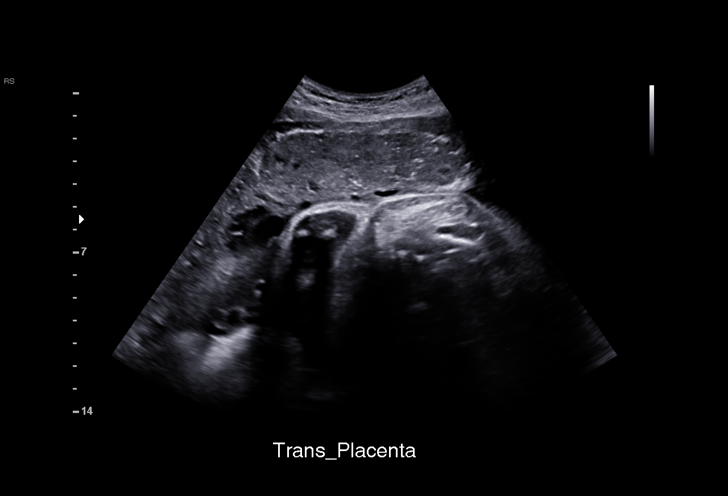
[im 16/31]
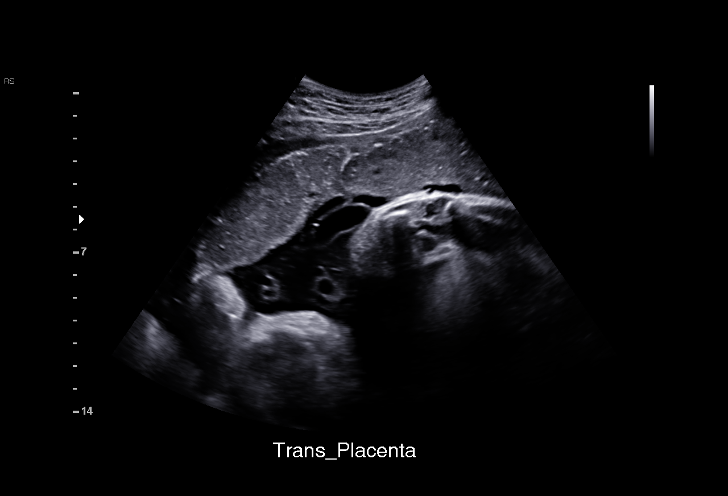
[im 17/31]
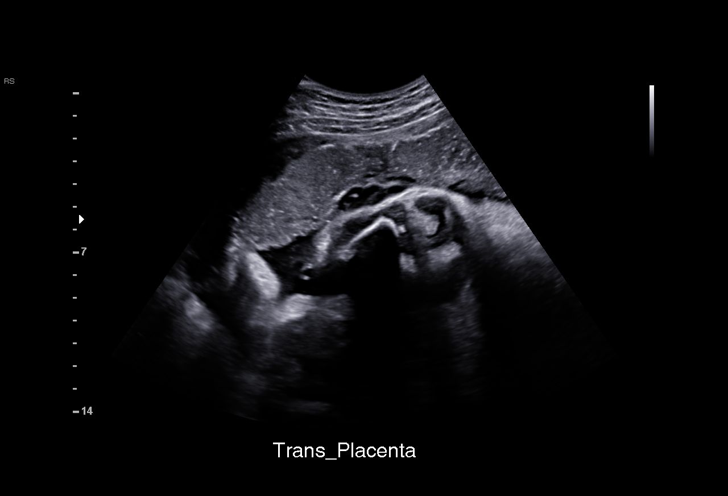
[im 19/31]
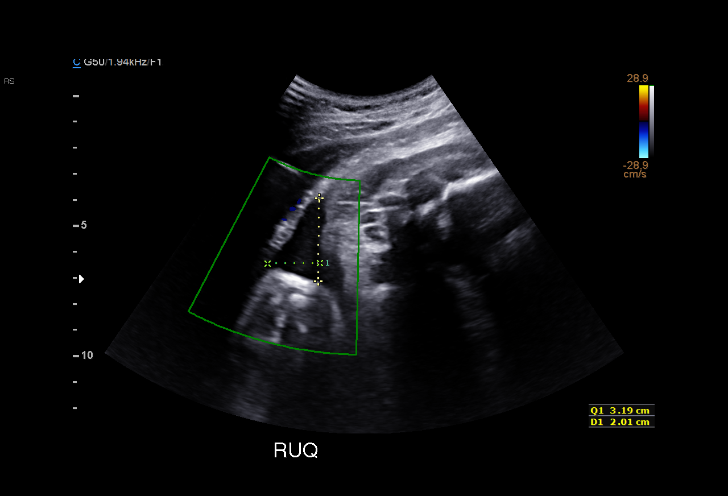
[im 22/31]
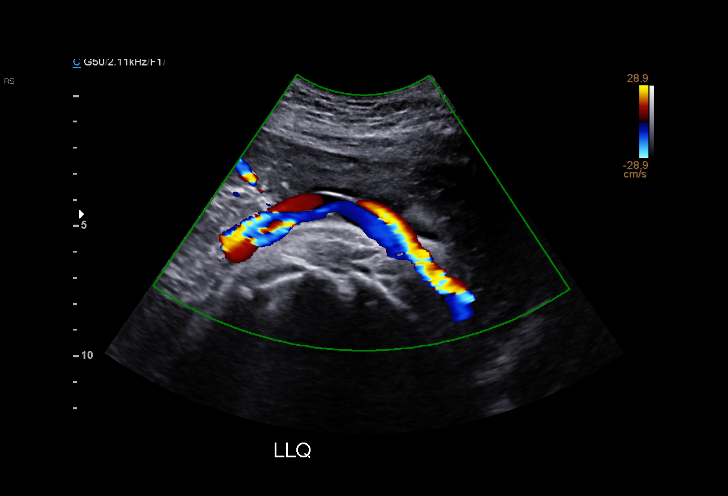
[im 24/31]
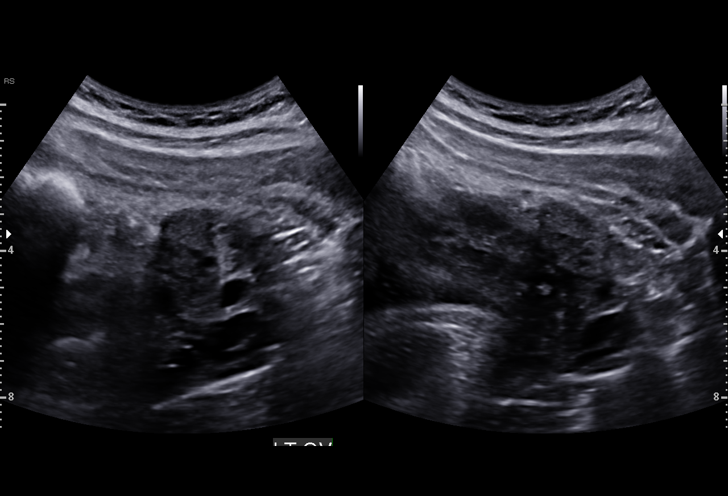
[im 26/31]
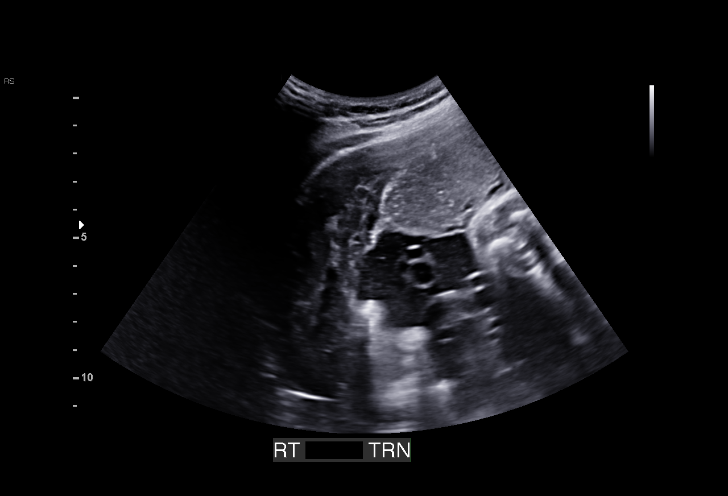
[im 28/31]
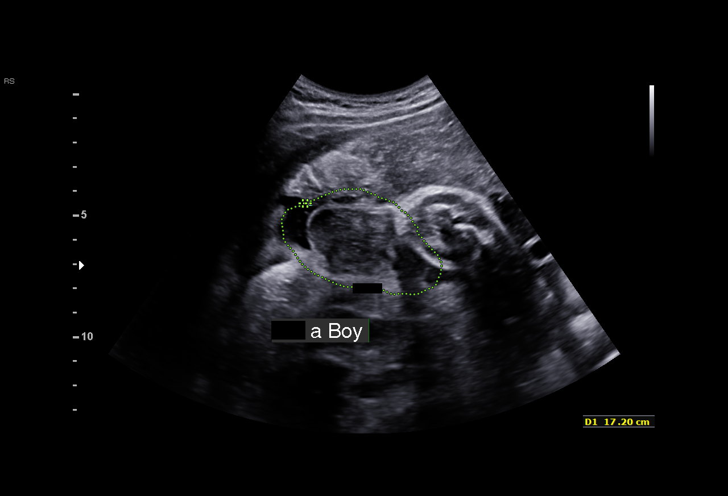
[im 31/31]
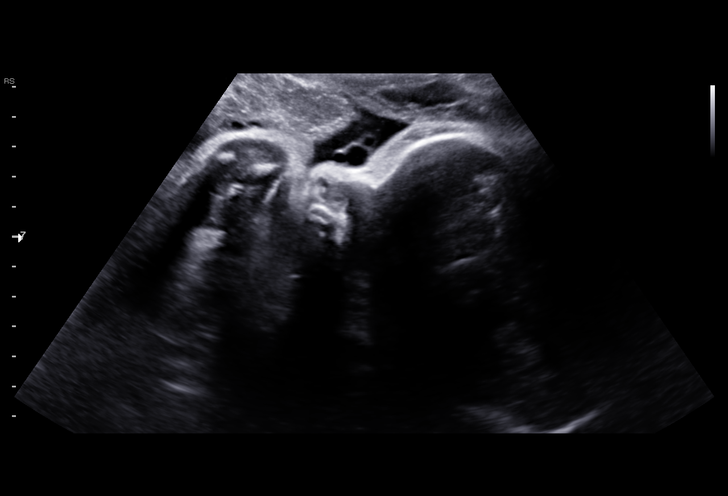

[15 of 28 positions shown; findings below may reference images not displayed]

1  OHIORS FAGBEJA SHITTU           [id number]      [id number]     [id number]
Indications

40 weeks gestation of pregnancy
Postdate pregnancy (40-42 weeks)
OB History

Gravidity:    1         Term:   0        Prem:   0        SAB:   0
TOP:          0       Ectopic:  0        Living: 0
Fetal Evaluation

Num Of Fetuses:     1
Fetal Heart         154
Rate(bpm):
Cardiac Activity:   Observed
Presentation:       Cephalic
Placenta:           Anterior, above cervical os
P. Cord Insertion:  Previously Visualized

Amniotic Fluid
AFI FV:      Subjectively within normal limits

AFI Sum(cm)     %Tile       Largest Pocket(cm)
8.64            18

RUQ(cm)                     LUQ(cm)        LLQ(cm)
3.19
Gestational Age

LMP:           40w 6d       Date:   11/21/14                 EDD:   08/28/15
Best:          39w 6d    Det. By:   Previous Ultrasound      EDD:   09/04/15
Anatomy

Stomach:               Appears normal, left   Bladder:                Appears normal
sided
Cervix Uterus Adnexa

Cervix
Not visualized (advanced GA >97wks)

Uterus
No abnormality visualized.

Left Ovary
Within normal limits.

Right Ovary
Not visualized.

Adnexa:       No abnormality visualized. No adnexal mass
visualized.
Impression

Single IUP at 39w 6d
Limited ultrasound performed to evaluate amniotic fluid
volume
Cephalic presentation
Reactive NST
Normal amniotic fluid volume (AFI 8.6 cm)
Normal modified BPP
Recommendations

Patient scheduled for inductino of labor tomorrow

## 2017-09-13 ENCOUNTER — Encounter (HOSPITAL_COMMUNITY): Payer: Self-pay | Admitting: Emergency Medicine

## 2017-09-13 ENCOUNTER — Emergency Department (HOSPITAL_COMMUNITY)
Admission: EM | Admit: 2017-09-13 | Discharge: 2017-09-13 | Disposition: A | Discharge status: Home / Self Care | Payer: No Typology Code available for payment source | Attending: Emergency Medicine | Admitting: Emergency Medicine

## 2017-09-13 DIAGNOSIS — Y939 Activity, unspecified: Secondary | ICD-10-CM | POA: Diagnosis not present

## 2017-09-13 DIAGNOSIS — Y999 Unspecified external cause status: Secondary | ICD-10-CM | POA: Diagnosis not present

## 2017-09-13 DIAGNOSIS — M542 Cervicalgia: Secondary | ICD-10-CM | POA: Insufficient documentation

## 2017-09-13 DIAGNOSIS — Z79899 Other long term (current) drug therapy: Secondary | ICD-10-CM | POA: Insufficient documentation

## 2017-09-13 DIAGNOSIS — M549 Dorsalgia, unspecified: Secondary | ICD-10-CM | POA: Insufficient documentation

## 2017-09-13 DIAGNOSIS — Y92410 Unspecified street and highway as the place of occurrence of the external cause: Secondary | ICD-10-CM | POA: Insufficient documentation

## 2017-09-13 MED ORDER — METHOCARBAMOL 500 MG PO TABS: 500.0000 mg | ORAL_TABLET | Freq: Two times a day (BID) | ORAL | 0 refills | Status: DC

## 2017-09-13 MED ORDER — LIDOCAINE 5 % EX PTCH: 1.0000 | MEDICATED_PATCH | CUTANEOUS | 0 refills | Status: DC

## 2017-09-13 MED ORDER — IBUPROFEN 400 MG PO TABS
600.0000 mg | ORAL_TABLET | Freq: Once | ORAL | Status: AC
  Administered 2017-09-13: 600 mg via ORAL
  Filled 2017-09-13: qty 1
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
## 2017-09-13 NOTE — ED Notes (Signed)
Patient able to ambulate independently  
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
## 2017-09-13 NOTE — Discharge Instructions (Signed)
Expect your soreness to increase over the next 2-3 days. Take it easy, but do not lay around too much as this may make any stiffness worse.  °Antiinflammatory medications: Take 600 mg of ibuprofen every 6 hours or 440 mg (over the counter dose) to 500 mg (prescription dose) of naproxen every 12 hours for the next 3 days. After this time, these medications may be used as needed for pain. Take these medications with food to avoid upset stomach. Choose only one of these medications, do not take them together. °Acetaminophen (generic for Tylenol): Should you continue to have additional pain while taking the ibuprofen or naproxen, you may add in acetaminophen as needed. Your daily total maximum amount of acetaminophen from all sources should be limited to 4000mg/day for persons without liver problems, or 2000mg/day for those with liver problems. °Muscle relaxer: Robaxin is a muscle relaxer and may help loosen stiff muscles. Do not take the Robaxin while driving or performing other dangerous activities.  °Lidocaine patches: These are available via either prescription or over-the-counter. The over-the-counter option may be more economical one and are likely just as effective. There are multiple over-the-counter brands, such as Salonpas. °Exercises: Be sure to perform the attached exercises starting with three times a week and working up to performing them daily. This is an essential part of preventing long term problems.  °Follow up: Follow up with a primary care provider for any future management of these complaints. Be sure to follow up within 7-10 days. °Return: Return to the ED should symptoms worsen. °

## 2018-06-02 ENCOUNTER — Other Ambulatory Visit: Payer: Self-pay

## 2018-06-02 ENCOUNTER — Emergency Department (HOSPITAL_COMMUNITY): Payer: Self-pay

## 2018-06-02 ENCOUNTER — Encounter (HOSPITAL_COMMUNITY): Payer: Self-pay | Admitting: Emergency Medicine

## 2018-06-02 ENCOUNTER — Emergency Department (HOSPITAL_COMMUNITY)
Admission: EM | Admit: 2018-06-02 | Discharge: 2018-06-02 | Disposition: A | Discharge status: Home / Self Care | Payer: Self-pay | Attending: Emergency Medicine | Admitting: Emergency Medicine

## 2018-06-02 DIAGNOSIS — M25572 Pain in left ankle and joints of left foot: Secondary | ICD-10-CM

## 2018-06-02 DIAGNOSIS — Y999 Unspecified external cause status: Secondary | ICD-10-CM | POA: Insufficient documentation

## 2018-06-02 DIAGNOSIS — Y929 Unspecified place or not applicable: Secondary | ICD-10-CM | POA: Insufficient documentation

## 2018-06-02 DIAGNOSIS — Y9302 Activity, running: Secondary | ICD-10-CM | POA: Insufficient documentation

## 2018-06-02 DIAGNOSIS — X500XXA Overexertion from strenuous movement or load, initial encounter: Secondary | ICD-10-CM | POA: Insufficient documentation

## 2018-06-02 MED ORDER — IBUPROFEN 600 MG PO TABS: 600.0000 mg | ORAL_TABLET | Freq: Four times a day (QID) | ORAL | 0 refills | Status: DC | PRN

## 2018-06-02 NOTE — ED Notes (Signed)
Patient verbalizes understanding of discharge instructions. Opportunity for questioning and answers were provided. Armband removed by staff, pt discharged from ED.  

## 2018-06-02 NOTE — ED Triage Notes (Signed)
C/o L ankle pain.  States she was playing in yard with sister and twisted ankle and fell.

## 2018-06-02 NOTE — ED Provider Notes (Signed)
[facility]   CSN: [id number] Arrival date & time: 06/02/18  2113    History   Chief Complaint Chief Complaint  Patient presents with  . Ankle Pain    HPI Alyssa Randall is a 23 y.o. female.      Ankle Pain  Location:  Ankle Time since incident:  1 hour Injury: yes   Mechanism of injury comment:  Running and twisted ankle Ankle location:  L ankle Pain details:    Quality:  Aching   Radiates to:  Does not radiate   Onset quality:  Sudden   Duration:  1 hour   Timing:  Constant   Progression:  Unchanged Chronicity:  New Dislocation: no   Prior injury to area:  No Relieved by:  Elevation Worsened by:  Bearing weight Ineffective treatments:  None tried Associated symptoms: swelling   Associated symptoms: no decreased ROM, no muscle weakness, no numbness and no tingling     Past Medical History:  Diagnosis Date  . Infection    UTI    There are no active problems to display for this patient.   Past Surgical History:  Procedure Laterality Date  . NO PAST SURGERIES    . TOOTH EXTRACTION       OB History    Gravida  1   Para  1   Term  1   Preterm      AB      Living  1     SAB      TAB      Ectopic      Multiple  0   Live Births  1            Home Medications    Prior to Admission medications   Medication Sig Start Date End Date Taking? Authorizing Provider  benzonatate (TESSALON) 100 MG capsule Take 1 capsule (100 mg total) by mouth every 8 (eight) hours. 08/11/16   Antony MaduraHumes, Maddon Horton, PA-C  cephALEXin (KEFLEX) 500 MG capsule Take 1 capsule (500 mg total) by mouth 4 (four) times daily. 04/01/16   Elpidio AnisUpstill, Shari, PA-C  HYDROcodone-acetaminophen (HYCET) 7.5-325 mg/15 ml solution Take 10 mLs by mouth every 8 (eight) hours as needed (severe sore throat). 08/11/16   Antony MaduraHumes, Marv Alfrey, PA-C  ibuprofen (ADVIL) 600 MG tablet Take 1 tablet (600 mg total) by mouth every 6 (six) hours as needed for  mild pain or moderate pain. 06/02/18   Antony MaduraHumes, Andilyn Bettcher, PA-C  lidocaine (LIDODERM) 5 % Place 1 patch onto the skin daily. Remove & Discard patch within 12 hours or as directed by MD 09/13/17   Joy, Shawn C, PA-C  methocarbamol (ROBAXIN) 500 MG tablet Take 1 tablet (500 mg total) by mouth 2 (two) times daily. 09/13/17   Joy, Hillard DankerShawn C, PA-C  Prenatal Vit-Fe Fumarate-FA (PRENATAL MULTIVITAMIN) TABS tablet Take 1 tablet by mouth daily at 12 noon.    [provider]    Family History Family History  Problem Relation Age of Onset  . Diabetes Father     Social History Social History   Tobacco Use  . Smoking status: Never Smoker  . Smokeless tobacco: Never Used  Substance Use Topics  . Alcohol use: No  . Drug use: No     Allergies   Patient has no known allergies.   Review of Systems Review of Systems Ten systems reviewed and are negative for acute change, except as noted in the HPI.    Physical  Exam Updated Vital Signs BP 117/74 (BP Location: Right Arm)   Pulse 90   Temp 98.2 F (36.8 C) (Oral)   Resp 16   LMP 05/26/2018   SpO2 100%   Physical Exam Vitals signs and nursing note reviewed.  Constitutional:      General: She is not in acute distress.    Appearance: She is well-developed. She is not diaphoretic.     Comments: Nontoxic appearing and in NAD  HENT:     Head: Normocephalic and atraumatic.  Eyes:     General: No scleral icterus.    Conjunctiva/sclera: Conjunctivae normal.  Neck:     Musculoskeletal: Normal range of motion.  Cardiovascular:     Rate and Rhythm: Normal rate and regular rhythm.     Pulses: Normal pulses.     Comments: DP pulse 2+ in the LLE Pulmonary:     Effort: Pulmonary effort is normal. No respiratory distress.     Comments: Respirations even and unlabored Musculoskeletal: Normal range of motion.     Left ankle: She exhibits swelling (minimal). She exhibits normal range of motion, no ecchymosis and no deformity. Tenderness. Lateral  malleolus tenderness found. Achilles tendon normal.  Skin:    General: Skin is warm and dry.     Coloration: Skin is not pale.     Findings: No erythema or rash.  Neurological:     General: No focal deficit present.     Mental Status: She is alert and oriented to person, place, and time.     Coordination: Coordination normal.     Comments: Sensation to light touch intact in the LLE. Patient able to wiggle all toes.  Psychiatric:        Behavior: Behavior normal.      ED Treatments / Results  Labs (all labs ordered are listed, but only abnormal results are displayed) Labs Reviewed - No data to display  EKG None  Radiology Dg Ankle Complete Left  Result Date: 06/02/2018 CLINICAL DATA:  Fall with ankle pain EXAM: LEFT ANKLE COMPLETE - 3+ VIEW COMPARISON:  None. FINDINGS: There is no evidence of fracture, dislocation, or joint effusion. There is no evidence of arthropathy or other focal bone abnormality. Soft tissues are unremarkable. IMPRESSION: Negative. Electronically Signed   By: Jasmine Pang M.D.   On: 06/02/2018 22:09    Procedures Procedures (including critical care time)  Medications Ordered in ED Medications - No data to display   Initial Impression / Assessment and Plan / ED Course  I have reviewed the triage vital signs and the nursing notes.  Pertinent labs & imaging results that were available during my care of the patient were reviewed by me and considered in my medical decision making (see chart for details).        Patient presents to the emergency department for evaluation of L ankle pain. Patient neurovascularly intact on exam. Imaging negative for fracture, dislocation, bony deformity. No swelling, erythema, heat to touch to the affected area; no concern for septic joint. Compartments in the affected extremity are soft. Plan for supportive management including RICE and NSAIDs; primary care follow up as needed. Return precautions discussed and provided.  Patient discharged in stable condition with no unaddressed concerns.   Final Clinical Impressions(s) / ED Diagnoses   Final diagnoses:  Acute left ankle pain    ED Discharge Orders         Ordered    ibuprofen (ADVIL) 600 MG tablet  Every 6 hours PRN  06/02/18 2214           Antony Madura, PA-C 06/02/18 2215    Wynetta Fines, MD 06/08/18 1459

## 2018-10-04 ENCOUNTER — Other Ambulatory Visit: Payer: Self-pay

## 2018-10-04 DIAGNOSIS — Z20822 Contact with and (suspected) exposure to covid-19: Secondary | ICD-10-CM

## 2018-10-05 ENCOUNTER — Ambulatory Visit (HOSPITAL_COMMUNITY)
Admission: EM | Admit: 2018-10-05 | Discharge: 2018-10-05 | Disposition: A | Discharge status: Home / Self Care | Payer: 59

## 2018-10-05 ENCOUNTER — Other Ambulatory Visit: Payer: Self-pay

## 2018-10-05 ENCOUNTER — Encounter (HOSPITAL_COMMUNITY): Payer: Self-pay | Admitting: Emergency Medicine

## 2018-10-05 DIAGNOSIS — R6889 Other general symptoms and signs: Secondary | ICD-10-CM

## 2018-10-05 DIAGNOSIS — Z20822 Contact with and (suspected) exposure to covid-19: Secondary | ICD-10-CM

## 2018-10-05 DIAGNOSIS — R112 Nausea with vomiting, unspecified: Secondary | ICD-10-CM | POA: Diagnosis not present

## 2018-10-05 NOTE — ED Triage Notes (Signed)
Pt here with body aches and vomiting; pt sts was tested for covid yesterday and results are not back yet; pt needs work note

## 2018-10-05 NOTE — ED Provider Notes (Signed)
[place]    CSN: [id number] Arrival date & time: 10/05/18  1240      History   Chief Complaint Chief Complaint  Patient presents with  . Generalized Body Aches  . Emesis    HPI Alyssa Randall is a 23 y.o. female.   Patient presents with body aches and vomiting x1 day.  One episode of emesis today.  Patient had drive-through COVID test yesterday.  She has a positive coworker.  She states she is here for a work note to be out until her COVID test results are back.  Patient denies fever, chills, cough, shortness of breath, diarrhea, or other symptoms.  The history is provided by the patient.    Past Medical History:  Diagnosis Date  . Infection    UTI    There are no active problems to display for this patient.   Past Surgical History:  Procedure Laterality Date  . NO PAST SURGERIES    . TOOTH EXTRACTION      OB History    Gravida  1   Para  1   Term  1   Preterm      AB      Living  1     SAB      TAB      Ectopic      Multiple  0   Live Births  1            Home Medications    Prior to Admission medications   Medication Sig Start Date End Date Taking? Authorizing Provider  benzonatate (TESSALON) 100 MG capsule Take 1 capsule (100 mg total) by mouth every 8 (eight) hours. Patient not taking: Reported on 10/05/2018 08/11/16   Antonietta Breach, PA-C  cephALEXin (KEFLEX) 500 MG capsule Take 1 capsule (500 mg total) by mouth 4 (four) times daily. Patient not taking: Reported on 10/05/2018 04/01/16   Charlann Lange, PA-C  HYDROcodone-acetaminophen (HYCET) 7.5-325 mg/15 ml solution Take 10 mLs by mouth every 8 (eight) hours as needed (severe sore throat). Patient not taking: Reported on 10/05/2018 08/11/16   Antonietta Breach, PA-C  ibuprofen (ADVIL) 600 MG tablet Take 1 tablet (600 mg total) by mouth every 6 (six) hours as needed for mild pain or moderate pain. 06/02/18   Antonietta Breach, PA-C  lidocaine (LIDODERM) 5 % Place 1 patch onto the skin  daily. Remove & Discard patch within 12 hours or as directed by MD 09/13/17   Joy, Shawn C, PA-C  methocarbamol (ROBAXIN) 500 MG tablet Take 1 tablet (500 mg total) by mouth 2 (two) times daily. Patient not taking: Reported on 10/05/2018 09/13/17   Lorayne Bender, PA-C  Prenatal Vit-Fe Fumarate-FA (PRENATAL MULTIVITAMIN) TABS tablet Take 1 tablet by mouth daily at 12 noon.    [provider]    Family History Family History  Problem Relation Age of Onset  . Diabetes Father     Social History Social History   Tobacco Use  . Smoking status: Never Smoker  . Smokeless tobacco: Never Used  Substance Use Topics  . Alcohol use: No  . Drug use: No     Allergies   Patient has no known allergies.   Review of Systems Review of Systems  Constitutional: Negative for chills and fever.  HENT: Negative for congestion, ear pain, rhinorrhea and sore throat.   Eyes: Negative for pain and visual disturbance.  Respiratory: Negative for cough and shortness of breath.   Cardiovascular: Negative for chest  pain and palpitations.  Gastrointestinal: Positive for vomiting. Negative for abdominal pain and diarrhea.  Genitourinary: Negative for dysuria and hematuria.  Musculoskeletal: Negative for arthralgias and back pain.  Skin: Negative for color change and rash.  Neurological: Negative for seizures and syncope.  All other systems reviewed and are negative.    Physical Exam Triage Vital Signs ED Triage Vitals [10/05/18 1324]  Enc Vitals Group     BP 130/86     Pulse Rate 83     Resp 18     Temp 98 F (36.7 C)     Temp Source Oral     SpO2 98 %     Weight      Height      Head Circumference      Peak Flow      Pain Score 5     Pain Loc      Pain Edu?      Excl. in GC?    No data found.  Updated Vital Signs BP 130/86 (BP Location: Right Arm)   Pulse 83   Temp 98 F (36.7 C) (Oral)   Resp 18   SpO2 98%   Visual Acuity Right Eye Distance:   Left Eye Distance:    Bilateral Distance:    Right Eye Near:   Left Eye Near:    Bilateral Near:     Physical Exam Vitals signs and nursing note reviewed.  Constitutional:      General: She is not in acute distress.    Appearance: She is well-developed. She is not ill-appearing.  HENT:     Head: Normocephalic and atraumatic.     Right Ear: Tympanic membrane normal.     Left Ear: Tympanic membrane normal.     Nose: Nose normal.     Mouth/Throat:     Mouth: Mucous membranes are moist.     Pharynx: Oropharynx is clear.  Eyes:     Conjunctiva/sclera: Conjunctivae normal.  Neck:     Musculoskeletal: Neck supple.  Cardiovascular:     Rate and Rhythm: Normal rate and regular rhythm.     Heart sounds: No murmur.  Pulmonary:     Effort: Pulmonary effort is normal. No respiratory distress.     Breath sounds: Normal breath sounds.  Abdominal:     General: Bowel sounds are normal.     Palpations: Abdomen is soft.     Tenderness: There is no abdominal tenderness. There is no guarding or rebound.  Skin:    General: Skin is warm and dry.     Findings: No rash.  Neurological:     Mental Status: She is alert.      UC Treatments / Results  Labs (all labs ordered are listed, but only abnormal results are displayed) Labs Reviewed - No data to display  EKG   Radiology No results found.  Procedures Procedures (including critical care time)  Medications Ordered in UC Medications - No data to display  Initial Impression / Assessment and Plan / UC Course  I have reviewed the triage vital signs and the nursing notes.  Pertinent labs & imaging results that were available during my care of the patient were reviewed by me and considered in my medical decision making (see chart for details).    Vomiting.  Suspected COVID.  Instructed patient to self quarantine until her test results are back.  Instructed patient to go to the emergency department if she develops high fever, shortness of breath,  vomiting,  diarrhea, or other concerning symptoms.  Patient agrees with plan of care.     Final Clinical Impressions(s) / UC Diagnoses   Final diagnoses:  Non-intractable vomiting with nausea, unspecified vomiting type  Suspected Covid-19 Virus Infection     Discharge Instructions     Your COVID test is pending.  You should self quarantine until your test result is back and is negative.    Go to the emergency department if you develop high fever, shortness of breath, vomiting, diarrhea, or other concerning symptoms.         ED Prescriptions    None     PDMP not reviewed this encounter.   Mickie Bailate, Clare Fennimore H, NP 10/05/18 1341

## 2018-10-05 NOTE — Discharge Instructions (Addendum)
Your COVID test is pending.  You should self quarantine until your test result is back and is negative.    Go to the emergency department if you develop high fever, shortness of breath, vomiting, diarrhea, or other concerning symptoms.

## 2018-10-07 LAB — NOVEL CORONAVIRUS, NAA: SARS-CoV-2, NAA: NOT DETECTED

## 2019-02-10 ENCOUNTER — Ambulatory Visit (INDEPENDENT_AMBULATORY_CARE_PROVIDER_SITE_OTHER): Payer: No Typology Code available for payment source | Admitting: Obstetrics & Gynecology

## 2019-02-10 ENCOUNTER — Other Ambulatory Visit: Payer: Self-pay

## 2019-02-10 VITALS — BP 117/75 | HR 98 | Wt 159.0 lb

## 2019-02-10 DIAGNOSIS — Z01812 Encounter for preprocedural laboratory examination: Secondary | ICD-10-CM

## 2019-02-10 DIAGNOSIS — Z3202 Encounter for pregnancy test, result negative: Secondary | ICD-10-CM

## 2019-02-10 LAB — POCT URINE PREGNANCY: Preg Test, Ur: NEGATIVE

## 2019-02-10 NOTE — Patient Instructions (Signed)
Etonogestrel implant What is this medicine? ETONOGESTREL (et oh noe JES trel) is a contraceptive (birth control) device. It is used to prevent pregnancy. It can be used for up to 3 years. This medicine may be used for other purposes; ask your health care provider or pharmacist if you have questions. COMMON BRAND NAME(S): Implanon, Nexplanon What should I tell my health care provider before I take this medicine? They need to know if you have any of these conditions:  abnormal vaginal bleeding  blood vessel disease or blood clots  breast, cervical, endometrial, ovarian, liver, or uterine cancer  diabetes  gallbladder disease  heart disease or recent heart attack  high blood pressure  high cholesterol or triglycerides  kidney disease  liver disease  migraine headaches  seizures  stroke  tobacco smoker  an unusual or allergic reaction to etonogestrel, anesthetics or antiseptics, other medicines, foods, dyes, or preservatives  pregnant or trying to get pregnant  breast-feeding How should I use this medicine? This device is inserted just under the skin on the inner side of your upper arm by a health care professional. Talk to your pediatrician regarding the use of this medicine in children. Special care may be needed. Overdosage: If you think you have taken too much of this medicine contact a poison control center or emergency room at once. NOTE: This medicine is only for you. Do not share this medicine with others. What if I miss a dose? This does not apply. What may interact with this medicine? Do not take this medicine with any of the following medications:  amprenavir  fosamprenavir This medicine may also interact with the following medications:  acitretin  aprepitant  armodafinil  bexarotene  bosentan  carbamazepine  certain medicines for fungal infections like fluconazole, ketoconazole, itraconazole and voriconazole  certain medicines to treat  hepatitis, HIV or AIDS  cyclosporine  felbamate  griseofulvin  lamotrigine  modafinil  oxcarbazepine  phenobarbital  phenytoin  primidone  rifabutin  rifampin  rifapentine  St. John's wort  topiramate This list may not describe all possible interactions. Give your health care provider a list of all the medicines, herbs, non-prescription drugs, or dietary supplements you use. Also tell them if you smoke, drink alcohol, or use illegal drugs. Some items may interact with your medicine. What should I watch for while using this medicine? This product does not protect you against HIV infection (AIDS) or other sexually transmitted diseases. You should be able to feel the implant by pressing your fingertips over the skin where it was inserted. Contact your doctor if you cannot feel the implant, and use a non-hormonal birth control method (such as condoms) until your doctor confirms that the implant is in place. Contact your doctor if you think that the implant may have broken or become bent while in your arm. You will receive a user card from your health care provider after the implant is inserted. The card is a record of the location of the implant in your upper arm and when it should be removed. Keep this card with your health records. What side effects may I notice from receiving this medicine? Side effects that you should report to your doctor or health care professional as soon as possible:  allergic reactions like skin rash, itching or hives, swelling of the face, lips, or tongue  breast lumps, breast tissue changes, or discharge  breathing problems  changes in emotions or moods  coughing up blood  if you feel that the implant   may have broken or bent while in your arm  high blood pressure  pain, irritation, swelling, or bruising at the insertion site  scar at site of insertion  signs of infection at the insertion site such as fever, and skin redness, pain or  discharge  signs and symptoms of a blood clot such as breathing problems; changes in vision; chest pain; severe, sudden headache; pain, swelling, warmth in the leg; trouble speaking; sudden numbness or weakness of the face, arm or leg  signs and symptoms of liver injury like dark yellow or brown urine; general ill feeling or flu-like symptoms; light-colored stools; loss of appetite; nausea; right upper belly pain; unusually weak or tired; yellowing of the eyes or skin  unusual vaginal bleeding, discharge Side effects that usually do not require medical attention (report to your doctor or health care professional if they continue or are bothersome):  acne  breast pain or tenderness  headache  irregular menstrual bleeding  nausea This list may not describe all possible side effects. Call your doctor for medical advice about side effects. You may report side effects to FDA at 1-800-FDA-1088. Where should I keep my medicine? This drug is given in a hospital or clinic and will not be stored at home. NOTE: This sheet is a summary. It may not cover all possible information. If you have questions about this medicine, talk to your doctor, pharmacist, or health care provider.  2020 Elsevier/Gold Standard (2018-10-05 11:33:04)  

## 2019-02-10 NOTE — Progress Notes (Signed)
Pt is in office for Nexplanon removal and insertion today.  Pt has no other complaints.

## 2019-02-10 NOTE — Progress Notes (Signed)
GYNECOLOGY OFFICE PROCEDURE NOTE  Alyssa Randall is a 24 y.o. G1P1001 here for Nexplanon removal and Nexplanon insertion.  Last pap smear was 2017 and was normal.  No other gynecologic concerns.   Nexplanon Removal and Insertion  Patient identified, informed consent performed, consent signed.   Patient does understand that irregular bleeding is a very common side effect of this medication. She was advised to have backup contraception for one week after replacement of the implant. Pregnancy test in clinic today was negative.  Appropriate time out taken. Implanon site identified. Area prepped in usual sterile fashon. One ml of 1% lidocaine was used to anesthetize the area at the distal end of the implant. A small stab incision was made right beside the implant on the distal portion. The Nexplanon rod was grasped using hemostats and removed without difficulty. There was minimal blood loss. There were no complications. Area was then injected with 3 ml of 1 % lidocaine. She was re-prepped with betadine, Nexplanon removed from packaging, Device confirmed in needle, then inserted full length of needle and withdrawn per handbook instructions. Nexplanon was able to palpated in the patient's arm; patient palpated the insert herself.  There was minimal blood loss. Patient insertion site covered with gauze and a pressure bandage to reduce any bruising. The patient tolerated the procedure well and was given post procedure instructions.  She was advised to have backup contraception for one week.    Adam Phenix, MD Attending Obstetrician & Gynecologist, [facility]  02/10/2019

## 2019-03-03 ENCOUNTER — Ambulatory Visit: Payer: 59 | Admitting: Obstetrics & Gynecology

## 2019-03-03 DIAGNOSIS — Z01419 Encounter for gynecological examination (general) (routine) without abnormal findings: Secondary | ICD-10-CM

## 2019-04-26 ENCOUNTER — Encounter (HOSPITAL_COMMUNITY): Payer: Self-pay

## 2019-04-26 ENCOUNTER — Ambulatory Visit (HOSPITAL_COMMUNITY)
Admission: EM | Admit: 2019-04-26 | Discharge: 2019-04-26 | Disposition: A | Discharge status: Home / Self Care | Payer: 59 | Attending: Family Medicine | Admitting: Family Medicine

## 2019-04-26 ENCOUNTER — Other Ambulatory Visit: Payer: Self-pay

## 2019-04-26 DIAGNOSIS — J029 Acute pharyngitis, unspecified: Secondary | ICD-10-CM | POA: Insufficient documentation

## 2019-04-26 LAB — POCT RAPID STREP A: Streptococcus, Group A Screen (Direct): NEGATIVE

## 2019-04-26 MED ORDER — IBUPROFEN 600 MG PO TABS: 600.0000 mg | ORAL_TABLET | Freq: Three times a day (TID) | ORAL | 0 refills | Status: DC | PRN

## 2019-04-26 MED ORDER — CETIRIZINE HCL 10 MG PO TABS: 10.0000 mg | ORAL_TABLET | Freq: Every day | ORAL | 0 refills | Status: DC

## 2019-04-26 NOTE — Discharge Instructions (Addendum)
Your strep test was negative Believe this may be allergy related.  Zyrtec and ibuprofen for symptoms Follow up as needed for continued or worsening symptoms

## 2019-04-26 NOTE — ED Triage Notes (Signed)
Pt c/o 7/10 sore throat, pt states having dysphagiax2 days. Pt was tested for COVID yesterday and it was neg.

## 2019-04-26 NOTE — ED Provider Notes (Signed)
[facility]    CSN: [id number] Arrival date & time: 04/26/19  1441      History   Chief Complaint Chief Complaint  Patient presents with  . Sore Throat    HPI Alyssa Randall is a 24 y.o. female.   Patient is a 24 year old female presents today with approximate 2 days of sore throat, pain with swallowing, nasal congestion, rhinorrhea and headache.  Symptoms have been constant.  She has not been taking any medication for her symptoms.  Denies any fever, cough, chest congestion, chest pain, shortness of breath, diarrhea.  No known sick contacts.  Was tested for Covid yesterday and negative.  ROS per HPI      Past Medical History:  Diagnosis Date  . Infection    UTI    There are no problems to display for this patient.   Past Surgical History:  Procedure Laterality Date  . NO PAST SURGERIES    . TOOTH EXTRACTION      OB History    Gravida  1   Para  1   Term  1   Preterm      AB      Living  1     SAB      TAB      Ectopic      Multiple  0   Live Births  1            Home Medications    Prior to Admission medications   Medication Sig Start Date End Date Taking? Authorizing Provider  cetirizine (ZYRTEC) 10 MG tablet Take 1 tablet (10 mg total) by mouth daily. 04/26/19   Dahlia Byes A, NP  ibuprofen (ADVIL) 600 MG tablet Take 1 tablet (600 mg total) by mouth every 8 (eight) hours as needed for moderate pain. 04/26/19   Janace Aris, NP    Family History Family History  Problem Relation Age of Onset  . Diabetes Father     Social History Social History   Tobacco Use  . Smoking status: Never Smoker  . Smokeless tobacco: Never Used  Substance Use Topics  . Alcohol use: No  . Drug use: No     Allergies   Patient has no known allergies.   Review of Systems Review of Systems   Physical Exam Triage Vital Signs ED Triage Vitals  Enc Vitals Group     BP 04/26/19 1505 126/77     Pulse Rate 04/26/19 1505 84    Resp 04/26/19 1505 16     Temp 04/26/19 1505 98.3 F (36.8 C)     Temp Source 04/26/19 1505 Oral     SpO2 04/26/19 1505 98 %     Weight 04/26/19 1506 150 lb (68 kg)     Height 04/26/19 1506 5\' 1"  (1.549 m)     Head Circumference --      Peak Flow --      Pain Score 04/26/19 1505 8     Pain Loc --      Pain Edu? --      Excl. in GC? --    No data found.  Updated Vital Signs BP 126/77   Pulse 84   Temp 98.3 F (36.8 C) (Oral)   Resp 16   Ht 5\' 1"  (1.549 m)   Wt 150 lb (68 kg)   SpO2 98%   BMI 28.34 kg/m   Visual Acuity Right Eye Distance:   Left Eye Distance:   Bilateral Distance:  Right Eye Near:   Left Eye Near:    Bilateral Near:     Physical Exam Vitals and nursing note reviewed.  Constitutional:      General: She is not in acute distress.    Appearance: Normal appearance. She is not ill-appearing, toxic-appearing or diaphoretic.  HENT:     Head: Normocephalic.     Right Ear: Tympanic membrane and ear canal normal.     Left Ear: Tympanic membrane and ear canal normal.     Nose: Nose normal.     Mouth/Throat:     Pharynx: Oropharynx is clear. Uvula midline. Posterior oropharyngeal erythema present.  Eyes:     Conjunctiva/sclera: Conjunctivae normal.  Pulmonary:     Effort: Pulmonary effort is normal.  Musculoskeletal:        General: Normal range of motion.     Cervical back: Normal range of motion.  Lymphadenopathy:     Cervical: No cervical adenopathy.  Skin:    General: Skin is warm and dry.     Findings: No rash.  Neurological:     Mental Status: She is alert.  Psychiatric:        Mood and Affect: Mood normal.      UC Treatments / Results  Labs (all labs ordered are listed, but only abnormal results are displayed) Labs Reviewed - No data to display  EKG   Radiology No results found.  Procedures Procedures (including critical care time)  Medications Ordered in UC Medications - No data to display  Initial Impression /  Assessment and Plan / UC Course  I have reviewed the triage vital signs and the nursing notes.  Pertinent labs & imaging results that were available during my care of the patient were reviewed by me and considered in my medical decision making (see chart for details).     Sore throat Most likely allergy related. We will have her start Zyrtec daily. ibuprofen as needed for pain.  Rapid strep test negative Follow up as needed for continued or worsening symptoms  Final Clinical Impressions(s) / UC Diagnoses   Final diagnoses:  Sore throat     Discharge Instructions     Your strep test was negative Believe this may be allergy related.  Zyrtec and ibuprofen for symptoms Follow up as needed for continued or worsening symptoms     ED Prescriptions    Medication Sig Dispense Auth. Provider   cetirizine (ZYRTEC) 10 MG tablet Take 1 tablet (10 mg total) by mouth daily. 30 tablet Torrie Lafavor A, NP   ibuprofen (ADVIL) 600 MG tablet Take 1 tablet (600 mg total) by mouth every 8 (eight) hours as needed for moderate pain. 30 tablet Loura Halt A, NP     PDMP not reviewed this encounter.   Orvan July, NP 04/26/19 1539

## 2019-04-29 LAB — CULTURE, GROUP A STREP (THRC)

## 2020-08-23 ENCOUNTER — Other Ambulatory Visit: Payer: Self-pay

## 2020-08-23 ENCOUNTER — Ambulatory Visit (HOSPITAL_COMMUNITY)
Admission: EM | Admit: 2020-08-23 | Discharge: 2020-08-23 | Disposition: A | Discharge status: Home / Self Care | Payer: Self-pay | Attending: Family Medicine | Admitting: Family Medicine

## 2020-08-23 ENCOUNTER — Encounter (HOSPITAL_COMMUNITY): Payer: Self-pay

## 2020-08-23 ENCOUNTER — Ambulatory Visit (INDEPENDENT_AMBULATORY_CARE_PROVIDER_SITE_OTHER): Payer: Self-pay

## 2020-08-23 DIAGNOSIS — S29019A Strain of muscle and tendon of unspecified wall of thorax, initial encounter: Secondary | ICD-10-CM

## 2020-08-23 DIAGNOSIS — M546 Pain in thoracic spine: Secondary | ICD-10-CM

## 2020-08-23 MED ORDER — NAPROXEN 500 MG PO TABS: 500.0000 mg | ORAL_TABLET | Freq: Two times a day (BID) | ORAL | 0 refills | Status: DC | PRN

## 2020-08-23 MED ORDER — CYCLOBENZAPRINE HCL 5 MG PO TABS: 5.0000 mg | ORAL_TABLET | Freq: Three times a day (TID) | ORAL | 0 refills | Status: DC | PRN
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
## 2022-07-14 ENCOUNTER — Encounter: Payer: Medicaid Other | Admitting: Advanced Practice Midwife

## 2023-04-07 NOTE — Progress Notes (Signed)
 "  NEW GYNECOLOGY PATIENT Patient name: Alyssa Randall MRN [id number]  Date of birth: 10-04-1995 Chief Complaint:   No chief complaint on file.     History:  Alyssa Randall is a 28 y.o. G1P1001 being seen today for contraception management.  Here for nexplanon  removal - placed 2019  Discussed the use of AI scribe software for clinical note transcription with the patient, who gave verbal consent to proceed.  History of Present Illness Alyssa Randall is a 28 year old female who presents for Nexplanon  removal and transition to birth control pills.  She is seeking the removal of her Nexplanon  implant, which was placed in either 2017 or 2018, as she feels it is time for the transition to another form of birth control. The implant is located on the right side.  She is interested in switching to birth control pills, although she has not used them before. No personal history of high blood pressure, stroke, heart attack, or breast cancer. No tobacco use.  She experiences migraines but is unsure if they are accompanied by aura.  She mentions a weight change of forty pounds since the placement of the Nexplanon  implant, which may affect the ease of removal.      Gynecologic History No LMP recorded. Patient has had an implant. Contraception: Nexplanon  Last Pap: 05/2022 ASCUS, HPV negative Last Mammogram: n/a Last Colonoscopy: n/a  Obstetric History OB History  Gravida Para Term Preterm AB Living  1 1 1   1   SAB IAB Ectopic Multiple Live Births     0 1    # Outcome Date GA Lbr Len/2nd Weight Sex Type Anes PTL Lv  1 Term 09/05/15 [redacted]w[redacted]d / 00:54 6 lb 10 oz (3.005 kg) M Vag-Spont EPI  LIV    Past Medical History:  Diagnosis Date   Infection    UTI    Past Surgical History:  Procedure Laterality Date   NO PAST SURGERIES     TOOTH EXTRACTION      Current Outpatient Medications on File Prior to Visit  Medication Sig Dispense Refill   cetirizine  (ZYRTEC ) 10 MG tablet Take  1 tablet (10 mg total) by mouth daily. 30 tablet 0   cyclobenzaprine  (FLEXERIL ) 5 MG tablet Take 1 tablet (5 mg total) by mouth 3 (three) times daily as needed for muscle spasms. Do not drink alcohol or drive while taking this medication 15 tablet 0   ibuprofen  (ADVIL ) 600 MG tablet Take 1 tablet (600 mg total) by mouth every 8 (eight) hours as needed for moderate pain. 30 tablet 0   naproxen  (NAPROSYN ) 500 MG tablet Take 1 tablet (500 mg total) by mouth 2 (two) times daily as needed. 30 tablet 0   No current facility-administered medications on file prior to visit.    No Known Allergies  Social History:  reports that she has never smoked. She has never used smokeless tobacco. She reports that she does not drink alcohol and does not use drugs.  Family History  Problem Relation Age of Onset   Diabetes Father     The following portions of the patient's history were reviewed and updated as appropriate: allergies, current medications, past family history, past medical history, past social history, past surgical history and problem list.  Review of Systems Pertinent items noted in HPI and remainder of comprehensive ROS otherwise negative.  Physical Exam:  BP 119/76   Pulse 75   Ht 5' 2 (1.575 m)   Wt 184 lb (83.5 kg)  BMI 33.65 kg/m  Physical Exam Vitals and nursing note reviewed.  Constitutional:      Appearance: Normal appearance.  Cardiovascular:     Rate and Rhythm: Normal rate.  Pulmonary:     Effort: Pulmonary effort is normal.     Breath sounds: Normal breath sounds.  Neurological:     General: No focal deficit present.     Mental Status: She is alert and oriented to person, place, and time.  Psychiatric:        Mood and Affect: Mood normal.        Behavior: Behavior normal.        Thought Content: Thought content normal.        Judgment: Judgment normal.    NEXPLANON  REMOVAL The risks (including infection, bleeding, pain, and uterine perforation) and benefits of  the procedure were explained to the patient and Written informed consent was obtained.   Device was palpated in left upper arm. After time out, the skin was cleaned with alcohol and infiltrated with 1cc of 1% lidocaine  with epinephrine was used to infiltrate the skin and subcutaneous tissue deep to the device. The area was cleaned with betadine x3.  Using an 11 blade, the skin was incised and the implant was removed intact.  The implant was shown to the patient.  The skin was cleaned, incision covered with Steri-Strips, and an adhesive bandage.  Arm was wrapped and post procedure instructions provided to the patient.  OCPs chose as new contraceptive method.     Assessment and Plan:   Assessment & Plan Nexplanon  removal and contraception management She desires Nexplanon  removal and considers oral contraceptive pills. No contraindications for oral contraceptive use. Informed about backup contraception and risks of thromboembolism. Discussed alternative contraceptive options. - now s/p uncomplicated removal of Nexplanon  implant. - Initiate oral contraceptive pills. - Advise backup contraception for first week of pill use and during oral antibiotics. - Educate on risks of tobacco use and thromboembolism symptoms. - Discuss alternative contraceptive options: patch, ring, IUD, Depo injection.   Routine preventative health maintenance measures emphasized. Please refer to After Visit Summary for other counseling recommendations.   Follow-up: No follow-ups on file.      Carter Quarry, MD Obstetrician & Gynecologist, [facility] [employer], [facility] "

## 2023-04-08 ENCOUNTER — Encounter: Payer: Self-pay | Admitting: Obstetrics and Gynecology

## 2023-04-08 ENCOUNTER — Ambulatory Visit (INDEPENDENT_AMBULATORY_CARE_PROVIDER_SITE_OTHER): Admitting: Obstetrics and Gynecology

## 2023-04-08 VITALS — BP 119/76 | HR 75 | Ht 62.0 in | Wt 184.0 lb

## 2023-04-08 DIAGNOSIS — Z3046 Encounter for surveillance of implantable subdermal contraceptive: Secondary | ICD-10-CM | POA: Diagnosis not present

## 2023-04-08 DIAGNOSIS — Z3009 Encounter for other general counseling and advice on contraception: Secondary | ICD-10-CM | POA: Diagnosis not present

## 2023-04-08 MED ORDER — LEVONORGESTREL-ETHINYL ESTRAD 0.1-20 MG-MCG PO TABS: 1.0000 | ORAL_TABLET | Freq: Every day | ORAL | 11 refills | Status: DC

## 2023-04-08 NOTE — Patient Instructions (Signed)
 Your NEXPLANON implant was just removed Here is some helpful information on what you can expect and how to care for your removal site. 24 Hours wear your top bandage The compression bandage helps minimize bruising.  3-5 Days keep your implant site covered While the insertion site is healing, keep the area covered with a smaller bandage.

## 2023-04-08 NOTE — Progress Notes (Unsigned)
 Pt presents for Nexplanon removal. Nexplanon place 2019. Pt interested in [facility] pills.

## 2023-09-29 ENCOUNTER — Encounter (HOSPITAL_COMMUNITY): Payer: Self-pay | Admitting: Obstetrics and Gynecology

## 2023-09-29 ENCOUNTER — Inpatient Hospital Stay (HOSPITAL_COMMUNITY)

## 2023-09-29 ENCOUNTER — Inpatient Hospital Stay (HOSPITAL_COMMUNITY)
Admission: AD | Admit: 2023-09-29 | Discharge: 2023-09-29 | Disposition: A | Discharge status: Home / Self Care | Attending: Obstetrics and Gynecology | Admitting: Obstetrics and Gynecology

## 2023-09-29 DIAGNOSIS — Z3490 Encounter for supervision of normal pregnancy, unspecified, unspecified trimester: Secondary | ICD-10-CM

## 2023-09-29 DIAGNOSIS — R1032 Left lower quadrant pain: Secondary | ICD-10-CM | POA: Diagnosis present

## 2023-09-29 DIAGNOSIS — R109 Unspecified abdominal pain: Secondary | ICD-10-CM

## 2023-09-29 DIAGNOSIS — Z3A01 Less than 8 weeks gestation of pregnancy: Secondary | ICD-10-CM | POA: Diagnosis not present

## 2023-09-29 DIAGNOSIS — O26891 Other specified pregnancy related conditions, first trimester: Secondary | ICD-10-CM | POA: Diagnosis not present

## 2023-09-29 DIAGNOSIS — Z113 Encounter for screening for infections with a predominantly sexual mode of transmission: Secondary | ICD-10-CM | POA: Diagnosis present

## 2023-09-29 LAB — URINALYSIS, ROUTINE W REFLEX MICROSCOPIC
Bilirubin Urine: NEGATIVE
Glucose, UA: NEGATIVE mg/dL
Hgb urine dipstick: NEGATIVE
Ketones, ur: NEGATIVE mg/dL
Nitrite: NEGATIVE
Protein, ur: NEGATIVE mg/dL
Specific Gravity, Urine: 1.028 (ref 1.005–1.030)
pH: 6 (ref 5.0–8.0)

## 2023-09-29 LAB — WET PREP, GENITAL
Clue Cells Wet Prep HPF POC: NONE SEEN
Sperm: NONE SEEN
Trich, Wet Prep: NONE SEEN
WBC, Wet Prep HPF POC: <10 (ref <10)
Yeast Wet Prep HPF POC: NONE SEEN

## 2023-09-29 LAB — ABO/RH: ABO/RH(D): A POS

## 2023-09-29 LAB — CBC
HCT: 37.3 % (ref 36.0–46.0)
Hemoglobin: 12 g/dL (ref 12.0–15.0)
MCH: 27.1 pg (ref 26.0–34.0)
MCHC: 32.2 g/dL (ref 30.0–36.0)
MCV: 84.4 fL (ref 80.0–100.0)
Platelets: 360 K/uL (ref 150–400)
RBC: 4.42 MIL/uL (ref 3.87–5.11)
RDW: 13.2 % (ref 11.5–15.5)
WBC: 13.4 K/uL — ABNORMAL HIGH (ref 4.0–10.5)
nRBC: 0 % (ref 0.0–0.2)

## 2023-09-29 LAB — HCG, QUANTITATIVE, PREGNANCY: hCG, Beta Chain, Quant, S: 121221 m[IU]/mL — ABNORMAL HIGH (ref <5)

## 2023-09-29 NOTE — MAU Provider Note (Signed)
 History     CSN: [id number]  Arrival date and time: 09/29/23 1750   Event Date/Time   First Provider Initiated Contact with Patient 09/29/23 2026      Chief Complaint  Patient presents with   Abdominal Pain   HPI [name] is a 28 y.o. year old G68P1001 female at [redacted]w[redacted]d weeks gestation who presents to MAU reporting LLQ pain. She describes the pain as sharp. The pain started last night. She took a Tylenol  250 mg (1/2 Extra Strength) last night with relief. She took Tylenol  500 mg today at work without relief. She denies VB or abnormal vaginal discharge. She reports las SI was 09/27/2023. She plans to receive [facility] with [facility]; next appt is 10/07/2023. Her spouse is present and contributing to the history taking.    OB History     Gravida  2   Para  1   Term  1   Preterm      AB      Living  1      SAB      IAB      Ectopic      Multiple  0   Live Births  1           Past Medical History:  Diagnosis Date   Infection    UTI    Past Surgical History:  Procedure Laterality Date   NO PAST SURGERIES     TOOTH EXTRACTION      Family History  Problem Relation Age of Onset   Diabetes Father     Social History   Tobacco Use   Smoking status: Never   Smokeless tobacco: Never  Substance Use Topics   Alcohol use: No   Drug use: No    Allergies: No Known Allergies  Medications Prior to Admission  Medication Sig Dispense Refill Last Dose/Taking   cetirizine  (ZYRTEC ) 10 MG tablet Take 1 tablet (10 mg total) by mouth daily. (Patient not taking: Reported on 04/08/2023) 30 tablet 0    cyclobenzaprine  (FLEXERIL ) 5 MG tablet Take 1 tablet (5 mg total) by mouth 3 (three) times daily as needed for muscle spasms. Do not drink alcohol or drive while taking this medication (Patient not taking: Reported on 04/08/2023) 15 tablet 0    ibuprofen  (ADVIL ) 600 MG tablet Take 1 tablet (600 mg total) by mouth every 8 (eight) hours as needed for moderate pain. (Patient  not taking: Reported on 04/08/2023) 30 tablet 0    levonorgestrel -ethinyl estradiol (AVIANE) 0.1-20 MG-MCG tablet Take 1 tablet by mouth daily. 28 tablet 11    naproxen  (NAPROSYN ) 500 MG tablet Take 1 tablet (500 mg total) by mouth 2 (two) times daily as needed. (Patient not taking: Reported on 04/08/2023) 30 tablet 0     Review of Systems  Constitutional: Negative.   HENT: Negative.    Eyes: Negative.   Respiratory: Negative.    Cardiovascular: Negative.   Gastrointestinal: Negative.   Endocrine: Negative.   Genitourinary:  Positive for pelvic pain (lower LT side).  Musculoskeletal: Negative.   Skin: Negative.   Allergic/Immunologic: Negative.   Neurological: Negative.   Hematological: Negative.   Psychiatric/Behavioral: Negative.     Physical Exam   Blood pressure 124/71, pulse 89, temperature 98.9 F (37.2 C), temperature source Oral, resp. rate 14, height 5' 3 (1.6 m), weight 83.4 kg, last menstrual period 08/05/2023, SpO2 99%, unknown if currently breastfeeding.  Physical Exam Vitals and nursing note reviewed.  Constitutional:  Appearance: Normal appearance. She is obese.  Cardiovascular:     Rate and Rhythm: Normal rate.  Pulmonary:     Effort: Pulmonary effort is normal.  Genitourinary:    Comments: Swabs collected by patient using blind swab technique  Musculoskeletal:        General: Normal range of motion.  Neurological:     Mental Status: She is alert and oriented to person, place, and time.  Psychiatric:        Mood and Affect: Mood normal.        Behavior: Behavior normal.        Thought Content: Thought content normal.        Judgment: Judgment normal.    MAU Course  Procedures  MDM CCUA UPT CBC ABO/Rh HCG Wet Prep GC/CT -- Results pending  RPR -- Results pending  OB U/S < 14 wks TVUS  Results for orders placed or performed during the hospital encounter of 09/29/23 (from the past 24 hours)  Urinalysis, Routine w reflex microscopic -Urine,  Clean Catch     Status: Abnormal   Collection Time: 09/29/23  6:24 PM  Result Value Ref Range   Color, Urine YELLOW YELLOW   APPearance HAZY (A) CLEAR   Specific Gravity, Urine 1.028 1.005 - 1.030   pH 6.0 5.0 - 8.0   Glucose, UA NEGATIVE NEGATIVE mg/dL   Hgb urine dipstick NEGATIVE NEGATIVE   Bilirubin Urine NEGATIVE NEGATIVE   Ketones, ur NEGATIVE NEGATIVE mg/dL   Protein, ur NEGATIVE NEGATIVE mg/dL   Nitrite NEGATIVE NEGATIVE   Leukocytes,Ua TRACE (A) NEGATIVE   RBC / HPF 0-5 0 - 5 RBC/hpf   WBC, UA 0-5 0 - 5 WBC/hpf   Bacteria, UA RARE (A) NONE SEEN   Squamous Epithelial / HPF 6-10 0 - 5 /HPF   Mucus PRESENT   Wet prep, genital     Status: None   Collection Time: 09/29/23  6:30 PM   Specimen: Urine, Clean Catch  Result Value Ref Range   Yeast Wet Prep HPF POC NONE SEEN NONE SEEN   Trich, Wet Prep NONE SEEN NONE SEEN   Clue Cells Wet Prep HPF POC NONE SEEN NONE SEEN   WBC, Wet Prep HPF POC <10 <10   Sperm NONE SEEN   ABO/Rh     Status: None (Preliminary result)   Collection Time: 09/29/23  7:08 PM  Result Value Ref Range   ABO/RH(D) PENDING   CBC     Status: Abnormal   Collection Time: 09/29/23  7:10 PM  Result Value Ref Range   WBC 13.4 (H) 4.0 - 10.5 K/uL   RBC 4.42 3.87 - 5.11 MIL/uL   Hemoglobin 12.0 12.0 - 15.0 g/dL   HCT 62.6 63.9 - 53.9 %   MCV 84.4 80.0 - 100.0 fL   MCH 27.1 26.0 - 34.0 pg   MCHC 32.2 30.0 - 36.0 g/dL   RDW 86.7 88.4 - 84.4 %   Platelets 360 150 - 400 K/uL   nRBC 0.0 0.0 - 0.2 %    US  OB Comp Less 14 Wks Result Date: 09/29/2023 EXAM: OBSTETRIC ULTRASOUND FIRST TRIMESTER TECHNIQUE: Transvaginal first trimester obstetric pelvic duplex ultrasound was performed with real-time imaging, color flow Doppler imaging, and spectral analysis. COMPARISON: None provided. CLINICAL HISTORY: Abdominal pain during pregnancy in first trimester. FINDINGS: UTERUS: No intrauterine masses are seen. Cervix is closed and is unremarkable. GESTATIONAL SAC(S): Single  intrauterine gestational sac is identified. YOLK SAC: A normal-appearing yolk sac is identified.  EMBRYO(<11WK) /FETUS(>=11WK): A fetal pole is identified with a crown-rump length of 11 mm corresponding to an estimated gestational age of [redacted] weeks, 1 day. Heart rate is identified at 141 beats per minute. CROWN RUMP LENGTH: 11 mm RATE OF CARDIAC ACTIVITY: 141 beats per minute. RIGHT OVARY: The maternal ovaries are unremarkable. LEFT OVARY: The maternal ovaries are unremarkable. FREE FLUID: No free fluid within the cul-de-sac. MEASUREMENTS ESTIMATED GESTATIONAL AGE BY CURRENT ULTRASOUND: 7 weeks, 1 day ESTIMATED GESTATIONAL AGE BY LMP/PRIOR ULTRASOUND: ESTIMATED DUE DATE: 05/16/24 IMPRESSION: 1. Single living intrauterine gestation with an estimated gestational age of [redacted] week, 1 day Electronically signed by: Dorethia Molt MD 09/29/2023 07:56 PM EDT RP Workstation: [id number]    Assessment and Plan  1. Abdominal pain during pregnancy in first trimester (Primary) - Information provided on abdominal pain in pregnancy - Advised may take Tylenol  1000 mg every 8 hours prn pain   2. Intrauterine pregnancy - Information provided on Safe Meds in Pregnancy  3. [redacted] weeks gestation of pregnancy   - Discharge home - Keep scheduled appt with [facility] on 10/07/2023 - Patient verbalized an understanding of the plan of care and agrees.   Ala Cart, CNM 09/29/2023, 8:26 PM

## 2023-09-29 NOTE — MAU Note (Signed)
..  Alyssa Randall is a 28 y.o. at [redacted]w[redacted]d here in MAU reporting: last night patient states she started having sharp LLQ pain. She states she took tylenol  and it went away, then this morning the pain came back. She took tylenol  again and she continues to have the LLQ pain. Denies vb or abnormal discharge. Last IC was on 09/27/23.    LMP: 08/05/23 Pain score: 7 Vitals:   09/29/23 1821  BP: 124/71  Pulse: 89  Resp: 14  Temp: 98.9 F (37.2 C)  SpO2: 99%      Lab orders placed from triage:   UA

## 2023-09-29 NOTE — Discharge Instructions (Signed)

## 2023-09-30 ENCOUNTER — Ambulatory Visit: Payer: Self-pay | Admitting: Family Medicine

## 2023-09-30 LAB — GC/CHLAMYDIA PROBE AMP (~~LOC~~) NOT AT ARMC
Chlamydia: NEGATIVE
Comment: NEGATIVE
Comment: NORMAL
Neisseria Gonorrhea: NEGATIVE

## 2023-10-07 ENCOUNTER — Other Ambulatory Visit (INDEPENDENT_AMBULATORY_CARE_PROVIDER_SITE_OTHER): Payer: Self-pay

## 2023-10-07 ENCOUNTER — Ambulatory Visit (INDEPENDENT_AMBULATORY_CARE_PROVIDER_SITE_OTHER): Admitting: *Deleted

## 2023-10-07 VITALS — BP 118/80 | HR 83 | Wt 181.4 lb

## 2023-10-07 DIAGNOSIS — Z3A01 Less than 8 weeks gestation of pregnancy: Secondary | ICD-10-CM | POA: Diagnosis not present

## 2023-10-07 DIAGNOSIS — Z3A08 8 weeks gestation of pregnancy: Secondary | ICD-10-CM

## 2023-10-07 DIAGNOSIS — Z3480 Encounter for supervision of other normal pregnancy, unspecified trimester: Secondary | ICD-10-CM

## 2023-10-07 DIAGNOSIS — O3680X0 Pregnancy with inconclusive fetal viability, not applicable or unspecified: Secondary | ICD-10-CM

## 2023-10-07 DIAGNOSIS — Z3481 Encounter for supervision of other normal pregnancy, first trimester: Secondary | ICD-10-CM | POA: Diagnosis not present

## 2023-10-07 MED ORDER — BLOOD PRESSURE KIT DEVI: 1.0000 | 0 refills | Status: AC

## 2023-10-07 NOTE — Patient Instructions (Signed)

## 2023-10-07 NOTE — Progress Notes (Signed)
 New OB Intake  I connected with Alyssa Randall  on 10/07/23 at  3:10 PM EDT by In Person Visit and verified that I am speaking with the correct person using two identifiers. Nurse is located at [facility] and pt is located at [place].  I discussed the limitations, risks, security and privacy concerns of performing an evaluation and management service by telephone and the availability of in person appointments. I also discussed with the patient that there may be a patient responsible charge related to this service. The patient expressed understanding and agreed to proceed.  I explained I am completing New OB Intake today. We discussed EDD of 05/11/24 based on LMP of 08/05/23. Pt is G2P1001. I reviewed her allergies, medications and Medical/Surgical/OB history.    Patient Active Problem List   Diagnosis Date Noted   Supervision of other normal pregnancy, antepartum 10/07/2023     Concerns addressed today  Delivery Plans Plans to deliver at [facility]. Discussed the nature of our practice with multiple providers including residents and students as well as female and female providers. Due to the size of the practice, the delivering provider may not be the same as those providing prenatal care.   Patient is interested in water birth.  MyChart/Babyscripts MyChart access verified. I explained pt will have some visits in office and some virtually. Babyscripts instructions given and order placed. Patient verifies receipt of registration text/e-mail. Account successfully created and app downloaded. If patient is a candidate for Optimized scheduling, add to sticky note.   Blood Pressure Cuff/Weight Scale Blood pressure cuff ordered for patient to pick-up from [employer]. Explained after first prenatal appt pt will check weekly and document in Babyscripts. Patient does not have weight scale; patient may purchase if they desire to track weight weekly in Babyscripts.  Anatomy US  Explained first  scheduled US  will be around 19 weeks. Anatomy US  scheduled for TBD at TBD.  Is patient a candidate for Babyscripts Optimization? No, due to Risk Factors   First visit review I reviewed new OB appt with patient. Explained pt will be seen by Nidia Daring, NP at first visit. Discussed [place] genetic screening with patient. Requests Panorama and Horizon.. Routine prenatal labs OB Urine collected at today's visit. Initial prenatal labs deferred to New OB appt.   Last Pap No results found for: DIAGPAP  Rocky CHRISTELLA Ober, RN 10/07/2023  4:40 PM

## 2023-10-22 ENCOUNTER — Telehealth: Payer: Self-pay

## 2023-10-22 NOTE — Telephone Encounter (Signed)
Attempted to return call, no answer, left vm.

## 2023-10-23 ENCOUNTER — Telehealth: Payer: Self-pay

## 2023-10-23 NOTE — Telephone Encounter (Signed)
 Returned call, pt said she previously was having headaches the last few days rating 8/10 not relieved by Tylenol . Pt denies any other abnormal symptoms and states no headache today. Pt states home BP is 98/75, advised of extra strength tylenol  on safe med list and pt states that she has a list, advised to try that or report to mau if symptoms occur again, pt agreed.

## 2023-10-23 NOTE — Telephone Encounter (Signed)
Attempted to return call, no answer, left vm.

## 2023-10-29 ENCOUNTER — Encounter: Payer: Self-pay | Admitting: Obstetrics and Gynecology

## 2023-10-29 ENCOUNTER — Ambulatory Visit (INDEPENDENT_AMBULATORY_CARE_PROVIDER_SITE_OTHER): Admitting: Obstetrics and Gynecology

## 2023-10-29 VITALS — BP 113/78 | HR 76 | Wt 180.8 lb

## 2023-10-29 DIAGNOSIS — Z3A12 12 weeks gestation of pregnancy: Secondary | ICD-10-CM | POA: Diagnosis not present

## 2023-10-29 DIAGNOSIS — Z31430 Encounter of female for testing for genetic disease carrier status for procreative management: Secondary | ICD-10-CM

## 2023-10-29 DIAGNOSIS — Z3481 Encounter for supervision of other normal pregnancy, first trimester: Secondary | ICD-10-CM | POA: Diagnosis not present

## 2023-10-29 MED ORDER — ASPIRIN 81 MG PO TBEC: 81.0000 mg | DELAYED_RELEASE_TABLET | Freq: Every day | ORAL | 2 refills | Status: AC

## 2023-10-29 NOTE — Progress Notes (Signed)
 Pt having issues with login to Babyscripts for BP recordings. BP in office today 113/78.  No concerns at this time.

## 2023-10-29 NOTE — Progress Notes (Signed)
 INITIAL PRENATAL VISIT  Subjective:   Alyssa Randall is being seen today for her first obstetrical visit.  She is at [redacted]w[redacted]d gestation by LMP Her obstetrical history is significant for none.. Patient does intend to breast feed. Pregnancy history fully reviewed.  Patient reports frequent headaches .  Indications for ASA therapy (per uptodate) Two or more of the following: Nulliparity No Obesity (body mass index >30 kg/m2) Yes Age >=35 years No Sociodemographic characteristics (African American race, low socioeconomic level) Yes Personal risk factors (eg, previous pregnancy with low birth weight or small for gestational age infant, previous adverse pregnancy outcome [eg, stillbirth], interval >10 years between pregnancies) No   Objective:    Obstetric History OB History  Gravida Para Term Preterm AB Living  2 1 1  0 0 1  SAB IAB Ectopic Multiple Live Births  0 0 0 0 1    # Outcome Date GA Lbr Len/2nd Weight Sex Type Anes PTL Lv  2 Current           1 Term 09/05/15 [redacted]w[redacted]d / 00:54 6 lb 10 oz (3.005 kg) M Vag-Spont EPI  LIV    Past Medical History:  Diagnosis Date   Infection    UTI    Past Surgical History:  Procedure Laterality Date   NO PAST SURGERIES     TOOTH EXTRACTION      Current Outpatient Medications on File Prior to Visit  Medication Sig Dispense Refill   Blood Pressure Monitoring (BLOOD PRESSURE KIT) DEVI 1 Device by Does not apply route once a week. 1 each 0   Prenatal Vit-Fe Fumarate-FA (PRENATAL VITAMIN PO) Take 1 tablet by mouth daily. Gummies     Vitamin D, Ergocalciferol, (DRISDOL) 1.25 MG (50000 UNIT) CAPS capsule Take 50,000 Units by mouth every 7 (seven) days. (Patient not taking: Reported on 10/29/2023)     No current facility-administered medications on file prior to visit.    No Known Allergies  Social History:  reports that she has never smoked. She has never used smokeless tobacco. She reports that she does not drink alcohol and does not  use drugs.  Family History  Problem Relation Age of Onset   Healthy Mother    Diabetes Father    Prostate cancer Father     The following portions of the patient's history were reviewed and updated as appropriate: allergies, current medications, past family history, past medical history, past social history, past surgical history and problem list.  Review of Systems Review of Systems  All other systems reviewed and are negative.     Physical Exam:  BP 113/78   Pulse 76   Wt 180 lb 12.8 oz (82 kg)   LMP 08/05/2023 (Approximate)   BMI 32.03 kg/m  CONSTITUTIONAL: Well-developed, well-nourished female in no acute distress.  HENT:  Normocephalic, atraumatic.   NECK: Normal range of motion SKIN: Skin is warm and dry. MUSCULOSKELETAL: Normal range of motion NEUROLOGIC: Alert and oriented  PSYCHIATRIC: Normal mood and affect. Normal behavior.  CARDIOVASCULAR: Normal heart rate noted RESPIRATORY: normal effort ABDOMEN: Soft PELVIC:deferred  Fetal Heart Rate (bpm): 164 (Simultaneous filing. User may not have seen previous data.)   Movement: Absent       Assessment:    Pregnancy: G2P1001  1. Encounter for supervision of other normal pregnancy in first trimester (Primary) BP and FHR normal  - PANORAMA PRENATAL TEST - Enroll Patient in PreNatal Babyscripts - Babyscripts Schedule Optimization  2. [redacted] weeks gestation of pregnancy Interested in waterbirth,class  info given, will schedule with midwife Discussed tx for headaches, tylenol , excedrine tension, mag oxide 400mg  nightly  - CBC/D/Plt+RPR+Rh+ABO+RubIgG... - HgB A1c  3. Encounter of female for testing for genetic disease carrier status for procreative management  - HORIZON Basic Panel     Plan:     Initial labs drawn. Prenatal vitamins. Problem list reviewed and updated. Reviewed in detail the nature of the practice with collaborative care between  Genetic screening discussed: NIPS/First trimester  screen/Quad/AFP ordered. Role of ultrasound in pregnancy discussed; Anatomy US : ordered. Discussed clinic routines, schedule of care and testing, genetic screening options, involvement of students and residents under the direct supervision of APPs and doctors and presence of female providers. Pt verbalized understanding.  Future Appointments  Date Time Provider Department Center  12/18/2023  2:00 PM [facility] PROVIDER 1 [facility]  12/18/2023  2:30 PM [facility] US4 [facility]     Delores Nidia CROME, FNP

## 2023-10-29 NOTE — Patient Instructions (Signed)
   Considering Waterbirth? Guide for patients at Center for [facility]) Why consider waterbirth? Gentle birth for babies  Less pain medicine used in labor  May allow for passive descent/less pushing  May reduce perineal tears  More mobility and instinctive maternal position changes  Increased maternal relaxation   Is waterbirth safe? What are the risks of infection, drowning or other complications? Infection:  Very low risk (3.7 % for tub vs 4.8% for bed)  7 in 8000 waterbirths with documented infection  Poorly cleaned equipment most common cause  Slightly lower group B strep transmission rate  Drowning  Maternal:  Very low risk  Related to seizures or fainting  Newborn:  Very low risk. No evidence of increased risk of respiratory problems in multiple large studies  Physiological protection from breathing under water  Avoid underwater birth if there are any fetal complications  Once baby's head is out of the water, keep it out.  Birth complication  Some reports of cord trauma, but risk decreased by bringing baby to surface gradually  No evidence of increased risk of shoulder dystocia. Mothers can usually change positions faster in water than in a bed, possibly aiding the maneuvers to free the shoulder.   There are 2 things you MUST do to have a waterbirth with [facility]: Attend a waterbirth class at [facility] at [facility]   3rd Wednesday of every month from 7-9 pm (virtual during COVID) [employer] at [email or web address] or [email or web address] or by calling [phone] Bring us  the certificate from the class to your prenatal appointment or send via MyChart Meet with a midwife at 36 weeks* to see if you can still plan a waterbirth and to sign the consent.   *We also recommend that you schedule as many of your prenatal visits with a midwife as possible.    Helpful information: You may want to bring a bathing suit top to the hospital  to wear during labor but this is optional.  All other supplies are provided by the hospital. Please arrive at the hospital with signs of active labor, and do not wait at home until late in labor. It takes 45 min- 1 hour for fetal monitoring, and check in to your room to take place, plus transport and filling of the waterbirth tub.    Things that would prevent you from having a waterbirth: Premature, <37wks  Previous cesarean birth  Presence of thick meconium-stained fluid  Multiple gestation (Twins, triplets, etc.)  Uncontrolled diabetes or gestational diabetes requiring medication  Hypertension diagnosed in pregnancy or preexisting hypertension (gestational hypertension, preeclampsia, or chronic hypertension) Fetal growth restriction (your baby measures less than 10th percentile on ultrasound) Heavy vaginal bleeding  Non-reassuring fetal heart rate  Active infection (MRSA, etc.). Group B Strep is NOT a contraindication for waterbirth.  If your labor has to be induced and induction method requires continuous monitoring of the baby's heart rate  Other risks/issues identified by your obstetrical provider   Please remember that birth is unpredictable. Under certain unforeseeable circumstances your provider may advise against giving birth in the tub. These decisions will be made on a case-by-case basis and with the safety of you and your baby as our highest priority.    Updated 04/10/21

## 2023-10-30 LAB — COMPREHENSIVE METABOLIC PANEL WITH GFR
ALT: 19 IU/L (ref 0–32)
AST: 17 IU/L (ref 0–40)
Albumin: 4.2 g/dL (ref 4.0–5.0)
Alkaline Phosphatase: 69 IU/L (ref 41–116)
BUN/Creatinine Ratio: 15 (ref 9–23)
BUN: 7 mg/dL (ref 6–20)
Bilirubin Total: <0.2 mg/dL (ref 0.0–1.2)
CO2: 17 mmol/L — ABNORMAL LOW (ref 20–29)
Calcium: 9.5 mg/dL (ref 8.7–10.2)
Chloride: 100 mmol/L (ref 96–106)
Creatinine, Ser: 0.48 mg/dL — ABNORMAL LOW (ref 0.57–1.00)
Globulin, Total: 2.6 g/dL (ref 1.5–4.5)
Glucose: 81 mg/dL (ref 70–99)
Potassium: 4.2 mmol/L (ref 3.5–5.2)
Sodium: 137 mmol/L (ref 134–144)
Total Protein: 6.8 g/dL (ref 6.0–8.5)
eGFR: 131 mL/min/1.73 (ref >59)

## 2023-10-30 LAB — CBC/D/PLT+RPR+RH+ABO+RUBIGG...
Antibody Screen: NEGATIVE
Basophils Absolute: 0 x10E3/uL (ref 0.0–0.2)
Basos: 0 %
EOS (ABSOLUTE): 0.7 x10E3/uL — ABNORMAL HIGH (ref 0.0–0.4)
Eos: 6 %
HCV Ab: NONREACTIVE
HIV Screen 4th Generation wRfx: NONREACTIVE
Hematocrit: 38.6 % (ref 34.0–46.6)
Hemoglobin: 12.2 g/dL (ref 11.1–15.9)
Hepatitis B Surface Ag: NEGATIVE
Immature Grans (Abs): 0.1 x10E3/uL (ref 0.0–0.1)
Immature Granulocytes: 1 %
Lymphocytes Absolute: 1.6 x10E3/uL (ref 0.7–3.1)
Lymphs: 14 %
MCH: 27.5 pg (ref 26.6–33.0)
MCHC: 31.6 g/dL (ref 31.5–35.7)
MCV: 87 fL (ref 79–97)
Monocytes Absolute: 0.6 x10E3/uL (ref 0.1–0.9)
Monocytes: 5 %
Neutrophils Absolute: 8.6 x10E3/uL — ABNORMAL HIGH (ref 1.4–7.0)
Neutrophils: 74 %
Platelets: 362 x10E3/uL (ref 150–450)
RBC: 4.43 x10E6/uL (ref 3.77–5.28)
RDW: 13 % (ref 11.7–15.4)
RPR Ser Ql: NONREACTIVE
Rh Factor: POSITIVE
Rubella Antibodies, IGG: 6.88 {index} (ref >0.99)
WBC: 11.7 x10E3/uL — ABNORMAL HIGH (ref 3.4–10.8)

## 2023-10-30 LAB — HCV INTERPRETATION

## 2023-10-30 LAB — HEMOGLOBIN A1C
Est. average glucose Bld gHb Est-mCnc: 120 mg/dL
Hgb A1c MFr Bld: 5.8 % — ABNORMAL HIGH (ref 4.8–5.6)

## 2023-10-31 ENCOUNTER — Ambulatory Visit: Payer: Self-pay | Admitting: Obstetrics and Gynecology

## 2023-10-31 DIAGNOSIS — D563 Thalassemia minor: Secondary | ICD-10-CM

## 2023-10-31 DIAGNOSIS — Z3480 Encounter for supervision of other normal pregnancy, unspecified trimester: Secondary | ICD-10-CM

## 2023-10-31 DIAGNOSIS — O99810 Abnormal glucose complicating pregnancy: Secondary | ICD-10-CM | POA: Insufficient documentation

## 2023-11-03 LAB — PANORAMA PRENATAL TEST FULL PANEL:PANORAMA TEST PLUS 5 ADDITIONAL MICRODELETIONS: FETAL FRACTION: 4.6

## 2023-11-06 ENCOUNTER — Other Ambulatory Visit

## 2023-11-06 DIAGNOSIS — Z3A28 28 weeks gestation of pregnancy: Secondary | ICD-10-CM

## 2023-11-07 LAB — CBC
Hematocrit: 39.7 % (ref 34.0–46.6)
Hemoglobin: 12.7 g/dL (ref 11.1–15.9)
MCH: 27.8 pg (ref 26.6–33.0)
MCHC: 32 g/dL (ref 31.5–35.7)
MCV: 87 fL (ref 79–97)
Platelets: 375 x10E3/uL (ref 150–450)
RBC: 4.57 x10E6/uL (ref 3.77–5.28)
RDW: 13 % (ref 11.7–15.4)
WBC: 10.5 x10E3/uL (ref 3.4–10.8)

## 2023-11-07 LAB — HORIZON CUSTOM: REPORT SUMMARY: POSITIVE — AB

## 2023-11-07 LAB — GLUCOSE TOLERANCE, 2 HOURS W/ 1HR
Glucose, 1 hour: 126 mg/dL (ref 70–179)
Glucose, 2 hour: 102 mg/dL (ref 70–152)
Glucose, Fasting: 76 mg/dL (ref 70–91)

## 2023-11-07 LAB — RPR: RPR Ser Ql: NONREACTIVE

## 2023-11-07 LAB — HIV ANTIBODY (ROUTINE TESTING W REFLEX): HIV Screen 4th Generation wRfx: NONREACTIVE

## 2023-11-08 DIAGNOSIS — D563 Thalassemia minor: Secondary | ICD-10-CM | POA: Insufficient documentation

## 2023-11-09 ENCOUNTER — Other Ambulatory Visit: Payer: Self-pay

## 2023-11-09 DIAGNOSIS — D563 Thalassemia minor: Secondary | ICD-10-CM

## 2023-11-26 ENCOUNTER — Encounter: Payer: Self-pay | Admitting: Obstetrics and Gynecology

## 2023-11-26 ENCOUNTER — Ambulatory Visit: Admitting: Obstetrics and Gynecology

## 2023-11-26 VITALS — BP 129/77 | HR 91 | Wt 178.7 lb

## 2023-11-26 DIAGNOSIS — O99810 Abnormal glucose complicating pregnancy: Secondary | ICD-10-CM | POA: Diagnosis not present

## 2023-11-26 DIAGNOSIS — Z3A16 16 weeks gestation of pregnancy: Secondary | ICD-10-CM

## 2023-11-26 DIAGNOSIS — D563 Thalassemia minor: Secondary | ICD-10-CM | POA: Diagnosis not present

## 2023-11-26 DIAGNOSIS — Z3480 Encounter for supervision of other normal pregnancy, unspecified trimester: Secondary | ICD-10-CM

## 2023-11-26 NOTE — Progress Notes (Signed)
 Pt presents for ROB visit. No concerns

## 2023-11-26 NOTE — Progress Notes (Signed)
   PRENATAL VISIT NOTE  Subjective:  Alyssa Randall is a 28 y.o. G2P1001 at [redacted]w[redacted]d being seen today for ongoing prenatal care.  She is currently monitored for the following issues for this low-risk pregnancy and has Supervision of other normal pregnancy, antepartum; Prediabetes in mother during pregnancy; and Alpha thalassemia silent carrier on their problem list.  Patient reports no complaints.  Contractions: Not present. Vag. Bleeding: None.  Movement: Present. Denies leaking of fluid.   The following portions of the patient's history were reviewed and updated as appropriate: allergies, current medications, past family history, past medical history, past social history, past surgical history and problem list.   Objective:   Vitals:   11/26/23 1401  BP: 129/77  Pulse: 91  Weight: 178 lb 11.2 oz (81.1 kg)    Fetal Status:  Fetal Heart Rate (bpm): 153   Movement: Present    General: Alert, oriented and cooperative. Patient is in no acute distress.  Skin: Skin is warm and dry. No rash noted.   Cardiovascular: Normal heart rate noted  Respiratory: Normal respiratory effort, no problems with respiration noted  Abdomen: Soft, gravid, appropriate for gestational age.  Pain/Pressure: Absent     Pelvic: Cervical exam deferred        Extremities: Normal range of motion.  Edema: None  Mental Status: Normal mood and affect. Normal behavior. Normal judgment and thought content.      Assessment and Plan:  Pregnancy: G2P1001 at [redacted]w[redacted]d 1. Supervision of other normal pregnancy, antepartum (Primary) BP and FHR normal Doing well, feeling regular movement    2. Alpha thalassemia silent carrier Genetic counseling 12/12 Partner testing provided  3. Prediabetes in mother during pregnancy Normal early GTT, plan repeat 26-28 wks  4. [redacted] weeks gestation of pregnancy Declined AFP today     Preterm labor symptoms and general obstetric precautions including but not limited to vaginal  bleeding, contractions, leaking of fluid and fetal movement were reviewed in detail with the patient. Please refer to After Visit Summary for other counseling recommendations.   No follow-ups on file.  Future Appointments  Date Time Provider Department Center  12/18/2023  1:00 PM [facility] GENETIC COUNSELING RM [facility]  12/18/2023  2:00 PM [facility] PROVIDER 1 [facility] Center  12/18/2023  2:30 PM [facility] US4 [facility]  12/24/2023 10:15 AM Davis, Devon E, PA-C [facility] None    Nidia Daring, [place]

## 2023-12-18 ENCOUNTER — Encounter: Payer: Self-pay | Admitting: Emergency Medicine

## 2023-12-18 ENCOUNTER — Ambulatory Visit: Admission: EM | Admit: 2023-12-18 | Discharge: 2023-12-18 | Disposition: A | Discharge status: Home / Self Care

## 2023-12-18 ENCOUNTER — Ambulatory Visit

## 2023-12-18 ENCOUNTER — Other Ambulatory Visit

## 2023-12-18 ENCOUNTER — Encounter

## 2023-12-18 DIAGNOSIS — J069 Acute upper respiratory infection, unspecified: Secondary | ICD-10-CM

## 2023-12-18 LAB — POCT INFLUENZA A/B
Influenza A, POC: NEGATIVE
Influenza B, POC: NEGATIVE

## 2023-12-18 LAB — POCT RAPID STREP A (OFFICE): Rapid Strep A Screen: NEGATIVE

## 2023-12-18 NOTE — ED Triage Notes (Signed)
 Pt presents c/o sore throat and headache x 2 days. Pt states,  I been having like a real bad headache for the past two days and a sore throat. I want to make sure it's not strep.  Pt denies emesis and diarrhea.

## 2023-12-18 NOTE — ED Provider Notes (Signed)
 [facility]    CSN: [id number] Arrival date & time: 12/18/23  0846      History   Chief Complaint Chief Complaint  Patient presents with   Sore Throat   Headache    HPI Alyssa Randall is a 28 y.o. female.   Pt presents today due to 2 days worth of nasal congestion, nasal drainage, cough, throat pain, and headache. Pt states that she has been using Tylenol  for symptoms with minimal relief. Pt states that everyone at work in positive for flu.   The history is provided by the patient.  Sore Throat Associated symptoms include headaches.  Headache   Past Medical History:  Diagnosis Date   Infection    UTI    Patient Active Problem List   Diagnosis Date Noted   Alpha thalassemia silent carrier 11/08/2023   Prediabetes in mother during pregnancy 10/31/2023   Supervision of other normal pregnancy, antepartum 10/07/2023   Vitamin D deficiency 05/14/2021   Prediabetes 05/14/2021   Overweight with body mass index (BMI) 25.0-29.9 05/09/2021   Contraceptive management 12/13/2012    Past Surgical History:  Procedure Laterality Date   NO PAST SURGERIES     TOOTH EXTRACTION      OB History     Gravida  2   Para  1   Term  1   Preterm  0   AB  0   Living  1      SAB  0   IAB  0   Ectopic  0   Multiple  0   Live Births  1            Home Medications    Prior to Admission medications  Medication Sig Start Date End Date Taking? Authorizing Provider  aspirin  EC 81 MG tablet Take 1 tablet (81 mg total) by mouth daily. Start taking when you are [redacted] weeks pregnant for rest of pregnancy for prevention of preeclampsia Patient not taking: Reported on 11/26/2023 10/29/23   Delores Nidia CROME, FNP  Blood Pressure Monitoring (BLOOD PRESSURE KIT) DEVI 1 Device by Does not apply route once a week. 10/07/23   Constant, Peggy, MD  Prenatal Vit-Fe Fumarate-FA (PRENATAL VITAMIN PO) Take 1 tablet by mouth daily. Gummies    [provider]   Vitamin D, Ergocalciferol, (DRISDOL) 1.25 MG (50000 UNIT) CAPS capsule Take 50,000 Units by mouth every 7 (seven) days. Patient not taking: Reported on 11/26/2023 09/22/23 03/20/24  [provider]    Family History Family History  Problem Relation Age of Onset   Healthy Mother    Diabetes Father    Prostate cancer Father     Social History Social History[1]   Allergies   Patient has no known allergies.   Review of Systems Review of Systems  Neurological:  Positive for headaches.     Physical Exam Triage Vital Signs ED Triage Vitals  Encounter Vitals Group     BP 12/18/23 0905 127/81     Girls Systolic BP Percentile --      Girls Diastolic BP Percentile --      Boys Systolic BP Percentile --      Boys Diastolic BP Percentile --      Pulse Rate 12/18/23 0905 88     Resp 12/18/23 0905 20     Temp 12/18/23 0905 98.9 F (37.2 C)     Temp Source 12/18/23 0905 Oral     SpO2 12/18/23 0905 97 %  Weight 12/18/23 0904 178 lb 12.7 oz (81.1 kg)     Height --      Head Circumference --      Peak Flow --      Pain Score 12/18/23 0903 7     Pain Loc --      Pain Education --      Exclude from Growth Chart --    No data found.  Updated Vital Signs BP 127/81 (BP Location: Right Arm)   Pulse 88   Temp 98.9 F (37.2 C) (Oral)   Resp 20   Wt 178 lb 12.7 oz (81.1 kg)   LMP 08/05/2023 (Approximate)   SpO2 97%   Breastfeeding No   BMI 31.67 kg/m   Visual Acuity Right Eye Distance:   Left Eye Distance:   Bilateral Distance:    Right Eye Near:   Left Eye Near:    Bilateral Near:     Physical Exam Vitals and nursing note reviewed.  Constitutional:      General: She is not in acute distress.    Appearance: Normal appearance. She is not ill-appearing, toxic-appearing or diaphoretic.  HENT:     Nose: Congestion (mildly enlarged turbinates) present. No rhinorrhea.     Mouth/Throat:     Mouth: Mucous membranes are moist.     Pharynx: Oropharynx is  clear. No oropharyngeal exudate or posterior oropharyngeal erythema.  Eyes:     General: No scleral icterus. Cardiovascular:     Rate and Rhythm: Normal rate and regular rhythm.     Heart sounds: Normal heart sounds.  Pulmonary:     Effort: Pulmonary effort is normal. No respiratory distress.     Breath sounds: Normal breath sounds. No wheezing or rhonchi.  Skin:    General: Skin is warm.  Neurological:     Mental Status: She is alert and oriented to person, place, and time.  Psychiatric:        Mood and Affect: Mood normal.        Behavior: Behavior normal.      UC Treatments / Results  Labs (all labs ordered are listed, but only abnormal results are displayed) Labs Reviewed  POCT RAPID STREP A (OFFICE) - Normal  POCT INFLUENZA A/B    EKG   Radiology No results found.  Procedures Procedures (including critical care time)  Medications Ordered in UC Medications - No data to display  Initial Impression / Assessment and Plan / UC Course  I have reviewed the triage vital signs and the nursing notes.  Pertinent labs & imaging results that were available during my care of the patient were reviewed by me and considered in my medical decision making (see chart for details).     Final Clinical Impressions(s) / UC Diagnoses   Final diagnoses:  Viral URI   Discharge Instructions   None    ED Prescriptions   None    PDMP not reviewed this encounter.    [1]  Social History Tobacco Use   Smoking status: Never    Passive exposure: Never   Smokeless tobacco: Never  Vaping Use   Vaping status: Never Used  Substance Use Topics   Alcohol use: No   Drug use: No     Andra Corean BROCKS, PA-C 12/18/23 1138

## 2023-12-18 NOTE — Discharge Instructions (Signed)
 You are neg for strep and flu  You been diagnosed with a viral illness today. -Viruses have to run their course and medicines that are prescribed are meant to help with symptoms. - With viruses usually feel poorly from 3 to 7 days with cough being the last symptoms to resolve.  -Cough can linger from days to weeks.  Antibiotics are not effective for viruses. -If your cough lasts more than 2 weeks and you are coughing so hard that you are vomiting or feel like you could pass out we need to follow-up with PCP for further testing and evaluation. -Rest, increase water intake, may use pseudoephedrine for nasal congestion, Delsym (dextromethorphan) or honey as needed for cough, and ibuprofen  and/or Tylenol  as directed on packaging for pain and fever. -If you have hypertension you should take Coricidin or other OTC meds approved for people with high blood pressure. -You may use a spoonful of honey every 4-6 hours as needed for throat pain and cough. -Warm tea with honey and lemon are helpful for soothe throat as well.  Chloraseptic and Cepacol make a throat lozenge with numbing medication, can be purchased over-the-counter. -May also use Flonase or sinus rinse for sinus pressure or nasal congestion.  Be sure to use distilled bottled water for sinus rinses. -May use coolmist humidifier to open up nasal passages -May elevate head to assist with postnasal drainage. -If you feel poorly (fever, fatigue, shortness of breath, nausea, etc.) for more than 10 days to be sure to follow-up with PCP or in clinic for further evaluation and additional treatments. If you experience chest pain with shortness of breath or pulse oxygen less than 95% you should report to the ER.

## 2023-12-24 ENCOUNTER — Encounter: Payer: Self-pay | Admitting: Physician Assistant

## 2023-12-24 ENCOUNTER — Encounter

## 2023-12-24 ENCOUNTER — Other Ambulatory Visit

## 2023-12-24 ENCOUNTER — Ambulatory Visit

## 2023-12-24 ENCOUNTER — Ambulatory Visit: Admitting: Obstetrics and Gynecology

## 2023-12-24 ENCOUNTER — Ambulatory Visit: Payer: Self-pay | Admitting: Physician Assistant

## 2023-12-24 ENCOUNTER — Other Ambulatory Visit: Payer: Self-pay | Admitting: *Deleted

## 2023-12-24 VITALS — BP 107/70 | HR 85 | Wt 176.0 lb

## 2023-12-24 VITALS — BP 114/66 | HR 84

## 2023-12-24 DIAGNOSIS — O99212 Obesity complicating pregnancy, second trimester: Secondary | ICD-10-CM

## 2023-12-24 DIAGNOSIS — R7303 Prediabetes: Secondary | ICD-10-CM | POA: Diagnosis not present

## 2023-12-24 DIAGNOSIS — D563 Thalassemia minor: Secondary | ICD-10-CM

## 2023-12-24 DIAGNOSIS — Z148 Genetic carrier of other disease: Secondary | ICD-10-CM | POA: Diagnosis present

## 2023-12-24 DIAGNOSIS — Z3492 Encounter for supervision of normal pregnancy, unspecified, second trimester: Secondary | ICD-10-CM

## 2023-12-24 DIAGNOSIS — Z3A19 19 weeks gestation of pregnancy: Secondary | ICD-10-CM

## 2023-12-24 DIAGNOSIS — O99012 Anemia complicating pregnancy, second trimester: Secondary | ICD-10-CM

## 2023-12-24 DIAGNOSIS — Z3480 Encounter for supervision of other normal pregnancy, unspecified trimester: Secondary | ICD-10-CM | POA: Diagnosis not present

## 2023-12-24 DIAGNOSIS — Z3A20 20 weeks gestation of pregnancy: Secondary | ICD-10-CM | POA: Diagnosis not present

## 2023-12-24 DIAGNOSIS — Z818 Family history of other mental and behavioral disorders: Secondary | ICD-10-CM | POA: Diagnosis not present

## 2023-12-24 DIAGNOSIS — Z6831 Body mass index (BMI) 31.0-31.9, adult: Secondary | ICD-10-CM | POA: Diagnosis not present

## 2023-12-24 DIAGNOSIS — Z809 Family history of malignant neoplasm, unspecified: Secondary | ICD-10-CM

## 2023-12-24 NOTE — Progress Notes (Signed)
 PRENATAL VISIT NOTE  Subjective:  Alyssa Randall is a 28 y.o. G2P1001 at [redacted]w[redacted]d being seen today for ongoing prenatal care.  She is currently monitored for the following issues for this low-risk pregnancy and has Supervision of other normal pregnancy, antepartum; Prediabetes in mother during pregnancy; Alpha thalassemia silent carrier; Contraceptive management; Overweight with body mass index (BMI) 25.0-29.9; Vitamin D deficiency; and Prediabetes on their problem list.  Patient reports no complaints.  Contractions: Not present. Vag. Bleeding: None.  Movement: Present. Denies leaking of fluid.   The following portions of the patient's history were reviewed and updated as appropriate: allergies, current medications, past family history, past medical history, past social history, past surgical history and problem list.   Objective:   Vitals:   12/24/23 1043  BP: 107/70  Pulse: 85  Weight: 176 lb (79.8 kg)    Fetal Status:  Fetal Heart Rate (bpm): 143 Fundal Height: 19 cm Movement: Present    General: Alert, oriented and cooperative. Patient is in no acute distress.  Skin: Skin is warm and dry. No rash noted.   Cardiovascular: Normal heart rate noted  Respiratory: Normal respiratory effort, no problems with respiration noted  Abdomen: Soft, gravid, appropriate for gestational age.  Pain/Pressure: Absent     Pelvic: Cervical exam deferred        Extremities: Normal range of motion.  Edema: None  Mental Status: Normal mood and affect. Normal behavior. Normal judgment and thought content.      10/29/2023    1:38 PM 10/07/2023    3:33 PM  Depression screen PHQ 2/9  Decreased Interest 0 0  Down, Depressed, Hopeless 0 0  PHQ - 2 Score 0 0  Altered sleeping 0 0  Tired, decreased energy 0 0  Change in appetite 0 0  Feeling bad or failure about yourself  0 0  Trouble concentrating 0 0  Moving slowly or fidgety/restless 0 0  Suicidal thoughts 0 0  PHQ-9 Score 0  0      Data  saved with a previous flowsheet row definition        10/29/2023    1:39 PM 10/07/2023    3:34 PM  GAD 7 : Generalized Anxiety Score  Nervous, Anxious, on Edge 0 0  Control/stop worrying 0 0  Worry too much - different things 0 0  Trouble relaxing 0 0  Restless 0 0  Easily annoyed or irritable 0 1  Afraid - awful might happen 0 0  Total GAD 7 Score 0 1    Assessment and Plan:  Pregnancy: G2P1001 at [redacted]w[redacted]d  1. Supervision of other normal pregnancy, antepartum (Primary) Patient doing well, feeling regular fetal movement BP, FHR, FH appropriate   2. [redacted] weeks gestation of pregnancy Anticipatory guidance about next visits/weeks of pregnancy given.   3. Alpha thalassemia silent carrier Saw genetic counseling Partner kit given  4. BMI 31.0-31.9,adult Continue ASA  5. Prediabetes Early GTT normal  Preterm labor symptoms and general obstetric precautions including but not limited to vaginal bleeding, contractions, leaking of fluid and fetal movement were reviewed in detail with the patient.  Please refer to After Visit Summary for other counseling recommendations.   Return in about 4 weeks (around 01/21/2024) for LOB.  Future Appointments  Date Time Provider Department Center  01/21/2024  8:55 AM Nicholaus Burnard HERO, MD [facility] None  02/18/2024  9:15 AM [facility] PROVIDER 1 [facility]  02/18/2024  9:30 AM [facility] US3 [facility]    Shaasia Odle E  Nicholaus, PA-C

## 2023-12-24 NOTE — Progress Notes (Signed)
 [facility] at [facility] [address] Phone:  [phone]   Fax:  [phone]      In-Person Genetic Counseling Clinic Note:   I spoke with 28 y.o. Alyssa Randall today to discuss her carrier screening results. She was referred by Delores Nidia CROME, FNP. She was accompanied by her husband Seena.   Pregnancy History:    G2P1001. EGA: [redacted]w[redacted]d by US . EDD: 05/16/2024. Shabria has a healthy daughter. Denies major personal health concerns. Denies bleeding, infections, and fevers in this pregnancy. Denies using tobacco, alcohol, or street drugs in this pregnancy.   Family History:    A three-generation pedigree was created and scanned into Epic under the Media tab.  Patient reports her paternal half-sister has two sons with bilateral postaxial polydactyly. We reviewed isolated postaxial polydactyly is inherited in an autosomal dominant manner with incomplete penetrance. It is more common in Black populations. We discussed the anatomy ultrasound detects many cases of polydactyly.  Patient reports a maternal half-sister who was born with a hole in her heart. This has since resolved without surgical/medical interventions. The patient reported a family history of an isolated congenital heart defect (CHD). We discussed that a heart defect can be isolated and have multifactorial inheritance. Heart defects can also be due to chromosomal or genetic differences. The risk for recurrence depends on the etiology of the heart defect and the exact type of defect involved. Without medical or genetic testing reports, recurrence risk is difficult to assess; however, it is likely not to be above the general population risk of ~1%. Second trimester targeted ultrasound can detect most significant cardiac defects.  Patient reports another maternal half-sister with a 2 yo daughter diagnosed with autism. We are unable to directly test for autism in a  pregnancy. Genetic testing for individuals with a clinical diagnosis of autism yields an explanation in only about 30% of cases, and the remaining 70% of cases are left with unknown etiology. Having an affected family member may increase the chance that Lorraina's children will have autism; however, without genetic testing performed on affected family members, it is difficult to assess risk to the pregnancy and other family members. The risk may be up to 50% in the case of an identified genetic cause. We also discussed and offered screening for fragile X syndrome, which is one of the most common causes of inherited intellectual disability and autism in males. Fragile X syndrome affects females as well. Torunn declined fragile X syndrome carrier screening.  FOB reports a personal and family history of mental health conditions. We reviewed these are typically multifactorial in nature and usually not due to a single genetic cause.  Patient and FOB report family histories of cancer. Notably, patient reports her father was diagnosed with pancreatic cancer at 30 yo. Different types of cancer can have a hereditary component that increases an individual's susceptibility to develop cancer; however, most cancers occur by chance due to a combination of genetics and the environment. It is recommended the couple inform their PCP about the family history so they can discuss appropriate screening and testing options, including a possible referral to cancer genetic counseling. Laraina declined a referral to cancer genetics.  Patient ethnicity reported as Black and FOB ethnicity reported as Black. Denies Ashkenazi Jewish ancestry.  Family history otherwise not remarkable for consanguinity, individuals with birth defects, intellectual disability, autism spectrum disorder, multiple spontaneous abortions, still births, or unexplained neonatal death.   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 She screened negative for the other three conditions (CF, SMA, and beta-hemoglobinopathies) which significantly reduces but does not eliminate the chance of being a carrier for those conditions. Please see report for residual risk information.  We reviewed the genetics of alpha thalassemia, autosomal recessive mode of inheritance, and clinical features of these conditions. We reviewed that Aloise will either pass down two copies of the alpha globin gene (??) OR one copy of the alpha globin gene (-?) in each pregnancy. Therefore, this pregnancy is not at increased risk for hemoglobin Bart's due to four deletions of the alpha globin genes (--/--) regardless of her reproductive partner's carrier status. The pregnancy will be at increased risk (25%) for hemoglobin H disease if her partner is an alpha thalassemia carrier in the cis configuration (--/??). We reviewed that this is more common in Southeast Asian populations and less common in those with Black ancestry.  Given these results, we discussed and offered carrier screening for Naasia's reproductive partner. We reviewed the benefits and limitations of carrier screening and that it can detect most but not all carriers.   The couple consented carrier screening for FOB. His blood was drawn during today's visit. He consented that we call Emryn with the results. We reviewed that if both were found to be carriers, prenatal diagnosis through amniocentesis would be available. We reviewed the technical aspects, benefits, risks, and limitations of amniocentesis including the 1 in 500 risk for miscarriage. Alternatively, testing can be completed postnatally. Of note, alpha thalassemia is not included on [place] 's Newborn Screening (NBS) program.  Newborn Screening. The [place]  Newborn Screening (NBS) program will screen all newborn babies for cystic  fibrosis, spinal muscular atrophy, hemoglobinopathies, and numerous other conditions. NBS does not include alpha thalassemia.  Previous Testing Completed:  Low risk NIPS: Shambria previously completed [facility] noninvasive prenatal screening (NIPS) in this pregnancy. The result is low risk, consistent with a female fetus. This screening significantly reduces but does not eliminate the chance that the current pregnancy has Down syndrome (trisomy 3), trisomy 30, trisomy 41, common sex chromosome conditions, and 22q11.2 microdeletion syndrome. Please see report for residual risk information. There are many genetic conditions that cannot be detected by NIPS.    Plan of Care:   Declined amniocentesis. FOB carrier screening for alpha thalassemia drawn today. We will communicate the results to the couple. Follow-up [facility] ultrasound on 02/18/2024.   Informed consent was obtained. All questions were answered.   45 minutes were spent on the date of the encounter in service to the patient including preparation, face-to-face consultation, discussion of test reports and available next steps, pedigree construction, genetic risk assessment, documentation, and care coordination.    Thank you for sharing in the care of Chrys with us .  Please do not hesitate to contact us  at [phone] if you have any questions.   Lauraine Bodily, MS, [facility] Certified Genetic Counselor   Genetic counseling student involved in appointment: No.

## 2023-12-24 NOTE — Progress Notes (Signed)
 Had anatomy US  this morning. Due date was changed from 05/11/24 to 05/16/24  No other concerns today

## 2023-12-24 NOTE — Progress Notes (Signed)
 Maternal-Fetal Medicine Consultation  Name: Alyssa Randall  MRN: [id number]  GA: [id number] [redacted]w[redacted]d   Patient is here for fetal anatomy scan.  On cell-free fetal DNA screening, the risks of aneuploidies are not increased.  Obstetrical history significant for term vaginal delivery in 2017 of female infant weighing 6 pounds and 10 ounces at birth.  Patient (vomited for apartment. She is a silent carrier for alpha thalassemia.  Ultrasound Fetal biometry is consistent with her previously-established dates by ultrasound. Normal amniotic fluid.  No makers of aneuploidies or fetal structural defects are seen.  Patient understands the limitations of ultrasound in detecting fetal anomalies.   Pregnancy dating After exploring her menstrual history, we explained the accuracy of early ultrasound dating.  We have amended her EDD at 05/16/2024.  Alpha Thalassemia-Silent Carrier I discussed the genetics and autosomal recessive mode of inheritance.  Briefly discussed the likelihood of variants of alpha thalassemia based on her partner's carrier status.  The pregnancy is not at risk if her partner is a silent carrier.  I have recommended partner screening. Patient had consultation with our genetic counselor and the partner had his blood drawn for carrier screening.  We will communicate the results to the patient and her partner.  Recommendations -An appointment was made for her to return in 8 weeks for fetal growth assessment (confirmation of dating).     Consultation including face-to-face (more than 50%) counseling 30 minutes.

## 2023-12-29 ENCOUNTER — Ambulatory Visit: Payer: Self-pay

## 2024-01-05 ENCOUNTER — Telehealth: Payer: Self-pay

## 2024-01-05 NOTE — Telephone Encounter (Signed)
 I spoke with the patient to discuss her partner's carrier screening results Laurance Cook DOB 07/31/1986). He was not found to be a carrier for alpha thalassemia. Please see report for details. A negative result on carrier screening reduces but does not eliminate the chance of being a carrier. The chance this couple's current and future pregnancies would be affected with this condition is very low.  Lauraine Bodily, MS, [facility] Certified Genetic Counselor [facility] for M[facility]etal [facility] [phone]

## 2024-01-05 NOTE — Telephone Encounter (Signed)
 Attempted to call patient with FOB's negative carrier screening results. Left message with callback number.

## 2024-01-21 ENCOUNTER — Ambulatory Visit (INDEPENDENT_AMBULATORY_CARE_PROVIDER_SITE_OTHER): Payer: Self-pay | Admitting: Obstetrics and Gynecology

## 2024-01-21 ENCOUNTER — Encounter: Payer: Self-pay | Admitting: Obstetrics and Gynecology

## 2024-01-21 VITALS — BP 115/72 | HR 87 | Wt 177.4 lb

## 2024-01-21 DIAGNOSIS — Z3480 Encounter for supervision of other normal pregnancy, unspecified trimester: Secondary | ICD-10-CM

## 2024-01-21 DIAGNOSIS — E663 Overweight: Secondary | ICD-10-CM | POA: Diagnosis not present

## 2024-01-21 DIAGNOSIS — D563 Thalassemia minor: Secondary | ICD-10-CM

## 2024-01-21 DIAGNOSIS — Z3482 Encounter for supervision of other normal pregnancy, second trimester: Secondary | ICD-10-CM | POA: Diagnosis not present

## 2024-01-21 DIAGNOSIS — Z3A23 23 weeks gestation of pregnancy: Secondary | ICD-10-CM

## 2024-01-21 NOTE — Progress Notes (Signed)
" ° °  PRENATAL VISIT NOTE  Subjective:  Alyssa Randall is a 29 y.o. G2P1001 at [redacted]w[redacted]d being seen today for ongoing prenatal care.  She is currently monitored for the following issues for this low-risk pregnancy and has Supervision of other normal pregnancy, antepartum; Prediabetes in mother during pregnancy; Alpha thalassemia silent carrier; Overweight with body mass index (BMI) 25.0-29.9; Vitamin D deficiency; and Prediabetes on their problem list.  Patient reports occasional cramping.  Contractions: Not present. Vag. Bleeding: None.  Movement: Present. Denies leaking of fluid.   The following portions of the patient's history were reviewed and updated as appropriate: allergies, current medications, past family history, past medical history, past social history, past surgical history and problem list.   Objective:   Vitals:   01/21/24 0901  BP: 115/72  Pulse: 87  Weight: 177 lb 6.4 oz (80.5 kg)    Fetal Status:  Fetal Heart Rate (bpm): 141   Movement: Present    General: Alert, oriented and cooperative. Patient is in no acute distress.  Skin: Skin is warm and dry. No rash noted.   Cardiovascular: Normal heart rate noted  Respiratory: Normal respiratory effort, no problems with respiration noted  Abdomen: Soft, gravid, appropriate for gestational age.  Pain/Pressure: Present     Pelvic: Cervical exam deferred        Extremities: Normal range of motion.  Edema: None  Mental Status: Normal mood and affect. Normal behavior. Normal judgment and thought content.      10/29/2023    1:38 PM 10/07/2023    3:33 PM  Depression screen PHQ 2/9  Decreased Interest 0 0  Down, Depressed, Hopeless 0 0  PHQ - 2 Score 0 0  Altered sleeping 0 0  Tired, decreased energy 0 0  Change in appetite 0 0  Feeling bad or failure about yourself  0 0  Trouble concentrating 0 0  Moving slowly or fidgety/restless 0 0  Suicidal thoughts 0 0  PHQ-9 Score 0  0      Data saved with a previous flowsheet row  definition        10/29/2023    1:39 PM 10/07/2023    3:34 PM  GAD 7 : Generalized Anxiety Score  Nervous, Anxious, on Edge 0 0  Control/stop worrying 0 0  Worry too much - different things 0 0  Trouble relaxing 0 0  Restless 0 0  Easily annoyed or irritable 0 1  Afraid - awful might happen 0 0  Total GAD 7 Score 0 1    Assessment and Plan:  Pregnancy: G2P1001 at [redacted]w[redacted]d  1. Supervision of other normal pregnancy, antepartum (Primary) Reviewed upcoming tdap, reviewed RSV vaccine/ab for baby Patient interested in waterbirth, gave info and will get next appt scheduled with CNM  2. Overweight with body mass index (BMI) 25.0-29.9 Cont baby aspirin   3. Alpha thalassemia silent carrier Fob negative  4. [redacted] weeks gestation of pregnancy   Preterm labor symptoms and general obstetric precautions including but not limited to vaginal bleeding, contractions, leaking of fluid and fetal movement were reviewed in detail with the patient. Please refer to After Visit Summary for other counseling recommendations.   Return in about 1 month (around 02/21/2024) for 2 hr GTT, low OB, 3rd trim labs.  Future Appointments  Date Time Provider Department Center  02/18/2024  9:15 AM [facility] PROVIDER 1 [facility]  02/18/2024  9:30 AM [facility] US3 [facility]    Burnard CHRISTELLA Moats, MD  "

## 2024-01-21 NOTE — Progress Notes (Signed)
 ROB  Pt has no concerns today/. States everything is going well

## 2024-01-21 NOTE — Patient Instructions (Signed)
   Considering Waterbirth? Guide for patients at Center for [facility]) Why consider waterbirth? Gentle birth for babies  Less pain medicine used in labor  May allow for passive descent/less pushing  May reduce perineal tears  More mobility and instinctive maternal position changes  Increased maternal relaxation   Is waterbirth safe? What are the risks of infection, drowning or other complications? Infection:  Very low risk (3.7 % for tub vs 4.8% for bed)  7 in 8000 waterbirths with documented infection  Poorly cleaned equipment most common cause  Slightly lower group B strep transmission rate  Drowning  Maternal:  Very low risk  Related to seizures or fainting  Newborn:  Very low risk. No evidence of increased risk of respiratory problems in multiple large studies  Physiological protection from breathing under water  Avoid underwater birth if there are any fetal complications  Once baby's head is out of the water, keep it out.  Birth complication  Some reports of cord trauma, but risk decreased by bringing baby to surface gradually  No evidence of increased risk of shoulder dystocia. Mothers can usually change positions faster in water than in a bed, possibly aiding the maneuvers to free the shoulder.   There are 2 things you MUST do to have a waterbirth with [facility]: Attend a waterbirth class at [facility] at [facility]   3rd Wednesday of every month from 7-9 pm (virtual during COVID) [employer] at [email or web address] or [email or web address] or by calling [phone] Bring us  the certificate from the class to your prenatal appointment or send via MyChart Meet with a midwife at 36 weeks* to see if you can still plan a waterbirth and to sign the consent.   *We also recommend that you schedule as many of your prenatal visits with a midwife as possible.    Helpful information: You may want to bring a bathing suit top to the hospital  to wear during labor but this is optional.  All other supplies are provided by the hospital. Please arrive at the hospital with signs of active labor, and do not wait at home until late in labor. It takes 45 min- 1 hour for fetal monitoring, and check in to your room to take place, plus transport and filling of the waterbirth tub.    Things that would prevent you from having a waterbirth: Premature, <37wks  Previous cesarean birth  Presence of thick meconium-stained fluid  Multiple gestation (Twins, triplets, etc.)  Uncontrolled diabetes or gestational diabetes requiring medication  Hypertension diagnosed in pregnancy or preexisting hypertension (gestational hypertension, preeclampsia, or chronic hypertension) Fetal growth restriction (your baby measures less than 10th percentile on ultrasound) Heavy vaginal bleeding  Non-reassuring fetal heart rate  Active infection (MRSA, etc.). Group B Strep is NOT a contraindication for waterbirth.  If your labor has to be induced and induction method requires continuous monitoring of the baby's heart rate  Other risks/issues identified by your obstetrical provider   Please remember that birth is unpredictable. Under certain unforeseeable circumstances your provider may advise against giving birth in the tub. These decisions will be made on a case-by-case basis and with the safety of you and your baby as our highest priority.    Updated 04/10/21

## 2024-02-12 NOTE — Patient Instructions (Signed)
   Considering Waterbirth? Guide for patients at Center for [facility]) Why consider waterbirth? Gentle birth for babies  Less pain medicine used in labor  May allow for passive descent/less pushing  May reduce perineal tears  More mobility and instinctive maternal position changes  Increased maternal relaxation   Is waterbirth safe? What are the risks of infection, drowning or other complications? Infection:  Very low risk (3.7 % for tub vs 4.8% for bed)  7 in 8000 waterbirths with documented infection  Poorly cleaned equipment most common cause  Slightly lower group B strep transmission rate  Drowning  Maternal:  Very low risk  Related to seizures or fainting  Newborn:  Very low risk. No evidence of increased risk of respiratory problems in multiple large studies  Physiological protection from breathing under water  Avoid underwater birth if there are any fetal complications  Once baby's head is out of the water, keep it out.  Birth complication  Some reports of cord trauma, but risk decreased by bringing baby to surface gradually  No evidence of increased risk of shoulder dystocia. Mothers can usually change positions faster in water than in a bed, possibly aiding the maneuvers to free the shoulder.   There are 2 things you MUST do to have a waterbirth with [facility]: Attend a waterbirth class at [facility] at [facility]   3rd Wednesday of every month from 7-9 pm (virtual during COVID) [employer] at [email or web address] or [email or web address] or by calling [phone] Bring us  the certificate from the class to your prenatal appointment or send via MyChart Meet with a midwife at 36 weeks* to see if you can still plan a waterbirth and to sign the consent.   *We also recommend that you schedule as many of your prenatal visits with a midwife as possible.    Helpful information: You may want to bring a bathing suit top to the hospital  to wear during labor but this is optional.  All other supplies are provided by the hospital. Please arrive at the hospital with signs of active labor, and do not wait at home until late in labor. It takes 45 min- 1 hour for fetal monitoring, and check in to your room to take place, plus transport and filling of the waterbirth tub.    Things that would prevent you from having a waterbirth: Premature, <37wks  Previous cesarean birth  Presence of thick meconium-stained fluid  Multiple gestation (Twins, triplets, etc.)  Uncontrolled diabetes or gestational diabetes requiring medication  Hypertension diagnosed in pregnancy or preexisting hypertension (gestational hypertension, preeclampsia, or chronic hypertension) Fetal growth restriction (your baby measures less than 10th percentile on ultrasound) Heavy vaginal bleeding  Non-reassuring fetal heart rate  Active infection (MRSA, etc.). Group B Strep is NOT a contraindication for waterbirth.  If your labor has to be induced and induction method requires continuous monitoring of the baby's heart rate  Other risks/issues identified by your obstetrical provider   Please remember that birth is unpredictable. Under certain unforeseeable circumstances your provider may advise against giving birth in the tub. These decisions will be made on a case-by-case basis and with the safety of you and your baby as our highest priority.    Updated 04/10/21

## 2024-02-12 NOTE — Progress Notes (Unsigned)
 "  PRENATAL VISIT NOTE  Subjective:  Alyssa Randall is a 29 y.o. G2P1001 at [redacted]w[redacted]d being seen today for ongoing prenatal care.  She is currently monitored for the following issues for this low-risk pregnancy and has Supervision of other normal pregnancy, antepartum; Prediabetes in mother during pregnancy; Alpha thalassemia silent carrier; Overweight with body mass index (BMI) 25.0-29.9; Vitamin D deficiency; and Prediabetes on their problem list.  Patient reports {[id number]}.   .  .   . Denies leaking of fluid.   The following portions of the patient's history were reviewed and updated as appropriate: allergies, current medications, past family history, past medical history, past social history, past surgical history and problem list.   Objective:   There were no vitals filed for this visit.  Fetal Status:           General: Alert, oriented and cooperative. Patient is in no acute distress.  Skin: Skin is warm and dry. No rash noted.   Cardiovascular: Normal heart rate noted  Respiratory: Normal respiratory effort, no problems with respiration noted  Abdomen: Soft, gravid, appropriate for gestational age.        Pelvic: Cervical exam deferred        Extremities: Normal range of motion.     Mental Status: Normal mood and affect. Normal behavior. Normal judgment and thought content.      10/29/2023    1:38 PM 10/07/2023    3:33 PM  Depression screen PHQ 2/9  Decreased Interest 0 0  Down, Depressed, Hopeless 0 0  PHQ - 2 Score 0 0  Altered sleeping 0 0  Tired, decreased energy 0 0  Change in appetite 0 0  Feeling bad or failure about yourself  0 0  Trouble concentrating 0 0  Moving slowly or fidgety/restless 0 0  Suicidal thoughts 0 0  PHQ-9 Score 0  0      Data saved with a previous flowsheet row definition        10/29/2023    1:39 PM 10/07/2023    3:34 PM  GAD 7 : Generalized Anxiety Score  Nervous, Anxious, on Edge 0  0   Control/stop worrying 0  0   Worry too much  - different things 0  0   Trouble relaxing 0  0   Restless 0  0   Easily annoyed or irritable 0  1   Afraid - awful might happen 0  0   Total GAD 7 Score 0 1     Data saved with a previous flowsheet row definition    Assessment and Plan:  Pregnancy: G2P1001 at [redacted]w[redacted]d 1. Supervision of other normal pregnancy, antepartum (Primary) - Doing well, feeling regular and vigorous fetal movement  2. [redacted] weeks gestation of pregnancy - Routine PNC, anticipatory guidance.  - Desires WB, recommend class and CNM visit (in person or virtual after taking class).   3. Alpha thalassemia silent carrier - Partner not a carrier  4. 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   - Pt is interested in waterbirth.  No contraindications at this time per chart review/patient assessment.   - Pt to enroll in class, see CNMs for most visits in the office.  - Discussed waterbirth as option for low-risk pregnancy.  Reviewed conditions that may arise during pregnancy that will risk pt out of waterbirth including hypertension, diabetes, fetal growth restriction <10%ile, etc.    Preterm labor symptoms and general obstetric precautions including but not limited to vaginal bleeding,  contractions, leaking of fluid and fetal movement were reviewed in detail with the patient. Please refer to After Visit Summary for other counseling recommendations.   No follow-ups on file.  Future Appointments  Date Time Provider Department Center  02/16/2024  8:00 AM [facility] None  02/16/2024  9:35 AM Regino Camie LABOR, CNM [facility] None  02/18/2024  9:15 AM [facility] PROVIDER 1 [facility]  02/18/2024  9:30 AM [facility] US3 [facility] LABOR Regino, CNM  "

## 2024-02-16 ENCOUNTER — Ambulatory Visit: Payer: Self-pay | Admitting: Certified Nurse Midwife

## 2024-02-16 ENCOUNTER — Other Ambulatory Visit: Payer: Self-pay

## 2024-02-16 DIAGNOSIS — D563 Thalassemia minor: Secondary | ICD-10-CM

## 2024-02-16 DIAGNOSIS — Z3A27 27 weeks gestation of pregnancy: Secondary | ICD-10-CM

## 2024-02-16 DIAGNOSIS — O99810 Abnormal glucose complicating pregnancy: Secondary | ICD-10-CM

## 2024-02-16 DIAGNOSIS — Z3480 Encounter for supervision of other normal pregnancy, unspecified trimester: Secondary | ICD-10-CM

## 2024-02-18 ENCOUNTER — Ambulatory Visit

## 2024-02-18 DIAGNOSIS — Z3480 Encounter for supervision of other normal pregnancy, unspecified trimester: Secondary | ICD-10-CM
